# Patient Record
Sex: Male | Born: 1965 | Race: Asian | Hispanic: No | Marital: Married | Smoking: Never smoker
Health system: Southern US, Community
[De-identification: ages and names within clinical notes are randomized; demographics above are authoritative.]

## PROBLEM LIST (undated history)

## (undated) DIAGNOSIS — S065XAA Traumatic subdural hemorrhage with loss of consciousness status unknown, initial encounter: Secondary | ICD-10-CM

## (undated) DIAGNOSIS — S065X9A Traumatic subdural hemorrhage with loss of consciousness of unspecified duration, initial encounter: Secondary | ICD-10-CM

## (undated) DIAGNOSIS — Z789 Other specified health status: Secondary | ICD-10-CM

## (undated) HISTORY — PX: COLONOSCOPY: SHX174

---

## 2017-11-06 DIAGNOSIS — E782 Mixed hyperlipidemia: Secondary | ICD-10-CM | POA: Diagnosis not present

## 2017-11-06 DIAGNOSIS — Z0000 Encounter for general adult medical examination without abnormal findings: Secondary | ICD-10-CM | POA: Diagnosis not present

## 2018-11-13 DIAGNOSIS — Z125 Encounter for screening for malignant neoplasm of prostate: Secondary | ICD-10-CM | POA: Diagnosis not present

## 2018-11-13 DIAGNOSIS — Z13220 Encounter for screening for lipoid disorders: Secondary | ICD-10-CM | POA: Diagnosis not present

## 2018-11-13 DIAGNOSIS — Z0000 Encounter for general adult medical examination without abnormal findings: Secondary | ICD-10-CM | POA: Diagnosis not present

## 2019-05-04 DIAGNOSIS — M542 Cervicalgia: Secondary | ICD-10-CM | POA: Diagnosis not present

## 2019-05-05 ENCOUNTER — Encounter (HOSPITAL_COMMUNITY): Payer: Self-pay | Admitting: Emergency Medicine

## 2019-05-05 ENCOUNTER — Emergency Department (HOSPITAL_COMMUNITY): Payer: 59

## 2019-05-05 ENCOUNTER — Emergency Department (HOSPITAL_COMMUNITY)
Admission: EM | Admit: 2019-05-05 | Discharge: 2019-05-05 | Disposition: A | Discharge status: Home / Self Care | Payer: 59 | Attending: Emergency Medicine | Admitting: Emergency Medicine

## 2019-05-05 ENCOUNTER — Other Ambulatory Visit: Payer: Self-pay

## 2019-05-05 DIAGNOSIS — R519 Headache, unspecified: Secondary | ICD-10-CM

## 2019-05-05 DIAGNOSIS — Z79899 Other long term (current) drug therapy: Secondary | ICD-10-CM | POA: Diagnosis not present

## 2019-05-05 DIAGNOSIS — R51 Headache: Secondary | ICD-10-CM | POA: Diagnosis present

## 2019-05-05 LAB — CBC WITH DIFFERENTIAL/PLATELET
Abs Immature Granulocytes: 0.02 10*3/uL (ref 0.00–0.07)
Basophils Absolute: 0 10*3/uL (ref 0.0–0.1)
Basophils Relative: 1 %
Eosinophils Absolute: 0 10*3/uL (ref 0.0–0.5)
Eosinophils Relative: 0 %
HCT: 37.6 % — ABNORMAL LOW (ref 39.0–52.0)
Hemoglobin: 12.6 g/dL — ABNORMAL LOW (ref 13.0–17.0)
Immature Granulocytes: 0 %
Lymphocytes Relative: 20 %
Lymphs Abs: 1.2 10*3/uL (ref 0.7–4.0)
MCH: 31 pg (ref 26.0–34.0)
MCHC: 33.5 g/dL (ref 30.0–36.0)
MCV: 92.4 fL (ref 80.0–100.0)
Monocytes Absolute: 0.4 10*3/uL (ref 0.1–1.0)
Monocytes Relative: 6 %
Neutro Abs: 4.3 10*3/uL (ref 1.7–7.7)
Neutrophils Relative %: 73 %
Platelets: 231 10*3/uL (ref 150–400)
RBC: 4.07 MIL/uL — ABNORMAL LOW (ref 4.22–5.81)
RDW: 11.9 % (ref 11.5–15.5)
WBC: 5.9 10*3/uL (ref 4.0–10.5)
nRBC: 0 % (ref 0.0–0.2)

## 2019-05-05 LAB — BASIC METABOLIC PANEL
Anion gap: 11 (ref 5–15)
BUN: 17 mg/dL (ref 6–20)
CO2: 22 mmol/L (ref 22–32)
Calcium: 9 mg/dL (ref 8.9–10.3)
Chloride: 106 mmol/L (ref 98–111)
Creatinine, Ser: 0.83 mg/dL (ref 0.61–1.24)
GFR calc Af Amer: >60 mL/min (ref >60)
GFR calc non Af Amer: >60 mL/min (ref >60)
Glucose, Bld: 115 mg/dL — ABNORMAL HIGH (ref 70–99)
Potassium: 3.7 mmol/L (ref 3.5–5.1)
Sodium: 139 mmol/L (ref 135–145)

## 2019-05-05 LAB — TROPONIN I: Troponin I: <0.03 ng/mL (ref <0.03)

## 2019-05-05 MED ORDER — IOHEXOL 300 MG/ML  SOLN: 100.0000 mL | Freq: Once | INTRAMUSCULAR | Status: DC

## 2019-05-05 MED ORDER — IOHEXOL 350 MG/ML SOLN
75.0000 mL | Freq: Once | INTRAVENOUS | Status: AC | PRN
  Administered 2019-05-05: 75 mL via INTRAVENOUS

## 2019-05-05 NOTE — ED Notes (Signed)
Patient transported to CT 

## 2019-05-05 NOTE — ED Triage Notes (Signed)
Pt started having a headache on Friday. No relief over weekend and began to have nausea, dizzyness and ringing of ears w/neck tightness. Pt seen at urgent care 05/04/19.  Temp: 97.4 Pain denies pain at this time

## 2019-05-05 NOTE — ED Notes (Signed)
Pt verbalized understanding of follow up and discharge instructions. IV removed,bleeding controlled and tolerated well. Pt ambulated to restroom with steady gait.

## 2019-05-05 NOTE — Discharge Instructions (Signed)
You have been diagnosed today with Headache.  At this time there does not appear to be the presence of an emergent medical condition, however there is always the potential for conditions to change. Please read and follow the below instructions.  Please return to the Emergency Department immediately for any new or worsening symptoms. Please be sure to follow up with your Primary Care Provider within one week regarding your visit today; please call their office to schedule an appointment even if you are feeling better for a follow-up visit. You may continue taking your muscle relaxer medication to help with your musculoskeletal neck pain.  Please get plenty of rest and drink plenty of water over the next few days to help with your symptoms.  Return to the Emergency department immediately for any new or worsening symptoms. Additionally your CT scan today showed plaque buildup on your arteries (aortic atherosclerosis), please discuss this with your primary care provider at your next visit.  Get help right away if: Your headache gets very bad quickly. Your headache gets worse after a lot of physical activity. You keep throwing up. You have a stiff neck. You have trouble seeing. You have trouble speaking. You have pain in the eye or ear. Your muscles are weak or you lose muscle control. You lose your balance or have trouble walking. You feel like you will pass out (faint) or you pass out. You are mixed up (confused). You have a seizure. Any new/concerning or worsening symptoms.  Please read the additional information packets attached to your discharge summary.  Do not take your medicine if  develop an itchy rash, swelling in your mouth or lips, or difficulty breathing.

## 2019-05-05 NOTE — ED Provider Notes (Signed)
[facility] EMERGENCY DEPARTMENT Provider Note   CSN: [id number] Arrival date & time: 05/05/19  1043    History   Chief Complaint Chief Complaint  Patient presents with   Headache    HPI John Michael is a 53 y.o. male presenting today for headache and nausea.  Patient reports that 3 days ago he developed a headache in his occipital area described as a throbbing sensation moderate intensity that was constant without radiation.  Patient reports some associated nausea without vomiting.  Patient reports that his headache is improved with lying down.  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  Patient states that he has had some tightness around his neck for the past several days.  He denies any injury or trauma.  Additionally patient reports ringing in his ears and dizziness upon standing that quickly resolves with staying still or with rest.    Patient reports that he was seen in urgent care yesterday and prescribed a muscle relaxer which has helped with his pain.  Patient reports that the muscle relaxer helped greatly yesterday with complete resolution of the symptoms.  Patient reports that upon awakening today and standing up he had return of his headache, tinnitus and nausea, EMS was called and patient was brought to the ER.  Upon initial evaluation patient is resting comfortably no acute distress.  Patient states that he is still having some mild nausea however states that his headache has completely resolved and he is no longer having pain.  Patient denies fever/chills, cough, chest pain/shortness of breath, abdominal pain, vomiting/diarrhea, vision changes, photophobia/phonophobia, injury/trauma, neck stiffness, numbness/weakness or tingling, difficulty speaking or any additional concerns.    HPI  History reviewed. No pertinent past medical history.  There are no active problems to display for this patient.   History  reviewed. No pertinent surgical history.    Home Medications    Prior to Admission medications   Medication Sig Start Date End Date Taking? Authorizing Provider  diclofenac (CATAFLAM) 50 MG tablet Take 50 mg by mouth 3 (three) times daily. 05/04/19  Yes [provider]  methocarbamol (ROBAXIN) 750 MG tablet Take 750 mg by mouth 3 (three) times daily. 05/04/19  Yes [provider]  Multiple Vitamin (MULTIVITAMIN WITH MINERALS) TABS tablet Take 1 tablet by mouth daily.   Yes [provider]    Family History No family history on file.  Social History Social History   Tobacco Use   Smoking status: Not on file  Substance Use Topics   Alcohol use: Yes   Drug use: Never     Allergies   Patient has no known allergies.   Review of Systems Review of Systems  Constitutional: Negative.  Negative for chills and fever.  Respiratory: Negative.  Negative for shortness of breath.   Cardiovascular: Negative.  Negative for chest pain.  Gastrointestinal: Positive for nausea. Negative for abdominal pain, diarrhea and vomiting.  Musculoskeletal: Positive for myalgias (Trapezius soreness bilateral). Negative for back pain and neck stiffness.  Neurological: Positive for light-headedness (Resolved) and headaches (Resolved). Negative for syncope, speech difficulty, weakness and numbness.  All other systems reviewed and are negative.  Physical Exam Updated Vital Signs BP 108/72    Pulse (!) 59    Temp 98.6 F (37 C) (Oral)    Resp 19    Ht 5\' 6"  (1.676 m)    Wt 63.5 kg    SpO2 98%    BMI  22.60 kg/m   Physical Exam Constitutional:      General: He is not in acute distress.    Appearance: Normal appearance. He is well-developed. He is not ill-appearing or diaphoretic.  HENT:     Head: Normocephalic and atraumatic. No raccoon eyes, Battle's sign, abrasion or contusion.     Jaw: There is normal jaw occlusion. No trismus.     Right Ear: Tympanic membrane normal.      Left Ear: Tympanic membrane normal.     Ears:     Comments: Hearing grossly intact bilaterally    Nose: Nose normal. No rhinorrhea.     Right Nostril: No epistaxis.     Left Nostril: No epistaxis.     Mouth/Throat:     Lips: Pink.     Mouth: Mucous membranes are moist.     Pharynx: Oropharynx is clear. Uvula midline.  Eyes:     General: Vision grossly intact. Gaze aligned appropriately.     Extraocular Movements: Extraocular movements intact.     Conjunctiva/sclera: Conjunctivae normal.     Pupils: Pupils are equal, round, and reactive to light.     Comments: Visual fields grossly intact bilaterally  Neck:     Musculoskeletal: Full passive range of motion without pain, normal range of motion and neck supple. No neck rigidity.     Trachea: Trachea and phonation normal. No tracheal tenderness or tracheal deviation.     Meningeal: Brudzinski's sign and Kernig's sign absent.  Cardiovascular:     Rate and Rhythm: Normal rate and regular rhythm.     Pulses:          Dorsalis pedis pulses are 2+ on the right side and 2+ on the left side.       Posterior tibial pulses are 2+ on the right side and 2+ on the left side.     Heart sounds: Normal heart sounds.  Pulmonary:     Effort: Pulmonary effort is normal. No respiratory distress.     Breath sounds: Normal breath sounds and air entry.  Abdominal:     General: Bowel sounds are normal. There is no distension.     Palpations: Abdomen is soft.     Tenderness: There is no abdominal tenderness. There is no guarding or rebound.  Musculoskeletal:     Comments: No midline C/T/L spinal tenderness to palpation, no paraspinal muscle tenderness, no deformity, crepitus, or step-off noted. No sign of injury to the neck or back.  Feet:     Right foot:     Protective Sensation: 3 sites tested. 3 sites sensed.     Left foot:     Protective Sensation: 3 sites tested. 3 sites sensed.  Skin:    General: Skin is warm and dry.  Neurological:     Mental  Status: He is alert and oriented to person, place, and time.     GCS: GCS eye subscore is 4. GCS verbal subscore is 5. GCS motor subscore is 6.     Comments: Mental Status: Alert, oriented, thought content appropriate, able to give a coherent history. Speech fluent without evidence of aphasia. Able to follow 2 step commands without difficulty. Cranial Nerves: II: Peripheral visual fields grossly normal, pupils equal, round, reactive to light III,IV, VI: ptosis not present, extra-ocular motions intact bilaterally V,VII: smile symmetric, eyebrows raise symmetric, facial light touch sensation equal VIII: hearing grossly normal to voice X: uvula elevates symmetrically XI: bilateral shoulder shrug symmetric and strong XII: midline tongue extension without  fassiculations Motor: Normal tone. 5/5 strength in upper and lower extremities bilaterally including strong and equal grip strength and dorsiflexion/plantar flexion Sensory: Sensation intact to light touch in all extremities.Negative Romberg.  Deep Tendon Reflexes: 2+ and symmetric in the biceps and patella Cerebellar: normal finger-to-nose maze with bilateral upper extremities. Normal heel-to -shin balance bilaterally of the lower extremity. No pronator drift.  Gait: normal gait and balance CV: distal pulses palpable throughout  Psychiatric:        Mood and Affect: Mood normal.        Behavior: Behavior is cooperative.    ED Treatments / Results  Labs (all labs ordered are listed, but only abnormal results are displayed) Labs Reviewed  CBC WITH DIFFERENTIAL/PLATELET - Abnormal; Notable for the following components:      Result Value   RBC 4.07 (*)    Hemoglobin 12.6 (*)    HCT 37.6 (*)    All other components within normal limits  BASIC METABOLIC PANEL - Abnormal; Notable for the following components:   Glucose, Bld 115 (*)    All other components within normal limits  TROPONIN I    EKG EKG  Interpretation  Date/Time:  Monday May 05 2019 12:03:08 EDT Ventricular Rate:  60 PR Interval:    QRS Duration: 84 QT Interval:  416 QTC Calculation: 416 R Axis:   69 Text Interpretation:  Sinus rhythm Abnormal R-wave progression, early transition ST elevation, consider anterior injury vs  Early repolarization (normal variant) no prior available for comparison Confirmed by Tilden Fossa [phone]) on 05/05/2019 12:28:33 PM   Radiology Ct Angio Head W/cm &/or Wo Cm  Result Date: 05/05/2019 CLINICAL DATA:  Occipital headache beginning 3 days ago, now with dizziness, lightheadedness, and posterior neck pain. EXAM: CT ANGIOGRAPHY HEAD AND NECK TECHNIQUE: Multidetector CT imaging of the head and neck was performed using the standard protocol during bolus administration of intravenous contrast. Multiplanar CT image reconstructions and MIPs were obtained to evaluate the vascular anatomy. Carotid stenosis measurements (when applicable) are obtained utilizing NASCET criteria, using the distal internal carotid diameter as the denominator. CONTRAST:  50 mL Omnipaque 350 COMPARISON:  None. FINDINGS: CT HEAD FINDINGS Brain: There is no evidence of acute infarct, intracranial hemorrhage, mass, midline shift, or extra-axial fluid collection. The ventricles and sulci are within normal limits for age. Vascular: No suspicious vessel density. Skull: No fracture or focal osseous lesion. Sinuses: Mild mucosal thickening in the frontal and ethmoid sinuses. Clear mastoid air cells. Orbits: Unremarkable. Review of the MIP images confirms the above findings CTA NECK FINDINGS Aortic arch: Standard 3 vessel aortic arch with mild atherosclerotic plaque. Widely patent arch vessel origins. Soft and calcified plaque in the proximal subclavian arteries without significant stenosis. Right carotid system: Patent without evidence of stenosis or dissection. Left carotid system: Patent with mild, predominantly calcified plaque about the  carotid bifurcation. No evidence of stenosis or dissection. Vertebral arteries: Patent and codominant without evidence of significant stenosis or dissection. Focal nonstenotic calcified plaque at the right vertebral artery origin. Skeleton: Mild cervicothoracic spondylosis. Other neck: No evidence of cervical lymphadenopathy or mass. Upper chest: Minimal scarring in the lung apices. Review of the MIP images confirms the above findings CTA HEAD FINDINGS Anterior circulation: The internal carotid arteries are widely patent from skull base to carotid termini. ACAs and MCAs are patent without evidence of proximal branch occlusion or significant stenosis. No aneurysm is identified. Posterior circulation: The intracranial vertebral arteries are widely patent to the basilar. Patent PICA, AICA,  and SCA origins are identified bilaterally. The basilar artery is widely patent. There are small posterior communicating arteries bilaterally. PCAs are patent without evidence of significant proximal stenosis. No aneurysm is identified. Venous sinuses: Patent. Anatomic variants: None. Delayed phase: No abnormal enhancement. Review of the MIP images confirms the above findings IMPRESSION: 1. Unremarkable CT appearance of the brain. No evidence of acute intracranial abnormality. 2. Mild atherosclerosis without evidence of large vessel occlusion, significant stenosis, or aneurysm in the head and neck. 3.  Aortic Atherosclerosis (ICD10-I70.0). Electronically Signed   By: Sebastian Ache M.D.   On: 05/05/2019 13:40   Ct Angio Neck W And/or Wo Contrast  Result Date: 05/05/2019 CLINICAL DATA:  Occipital headache beginning 3 days ago, now with dizziness, lightheadedness, and posterior neck pain. EXAM: CT ANGIOGRAPHY HEAD AND NECK TECHNIQUE: Multidetector CT imaging of the head and neck was performed using the standard protocol during bolus administration of intravenous contrast. Multiplanar CT image reconstructions and MIPs were obtained to  evaluate the vascular anatomy. Carotid stenosis measurements (when applicable) are obtained utilizing NASCET criteria, using the distal internal carotid diameter as the denominator. CONTRAST:  50 mL Omnipaque 350 COMPARISON:  None. FINDINGS: CT HEAD FINDINGS Brain: There is no evidence of acute infarct, intracranial hemorrhage, mass, midline shift, or extra-axial fluid collection. The ventricles and sulci are within normal limits for age. Vascular: No suspicious vessel density. Skull: No fracture or focal osseous lesion. Sinuses: Mild mucosal thickening in the frontal and ethmoid sinuses. Clear mastoid air cells. Orbits: Unremarkable. Review of the MIP images confirms the above findings CTA NECK FINDINGS Aortic arch: Standard 3 vessel aortic arch with mild atherosclerotic plaque. Widely patent arch vessel origins. Soft and calcified plaque in the proximal subclavian arteries without significant stenosis. Right carotid system: Patent without evidence of stenosis or dissection. Left carotid system: Patent with mild, predominantly calcified plaque about the carotid bifurcation. No evidence of stenosis or dissection. Vertebral arteries: Patent and codominant without evidence of significant stenosis or dissection. Focal nonstenotic calcified plaque at the right vertebral artery origin. Skeleton: Mild cervicothoracic spondylosis. Other neck: No evidence of cervical lymphadenopathy or mass. Upper chest: Minimal scarring in the lung apices. Review of the MIP images confirms the above findings CTA HEAD FINDINGS Anterior circulation: The internal carotid arteries are widely patent from skull base to carotid termini. ACAs and MCAs are patent without evidence of proximal branch occlusion or significant stenosis. No aneurysm is identified. Posterior circulation: The intracranial vertebral arteries are widely patent to the basilar. Patent PICA, AICA, and SCA origins are identified bilaterally. The basilar artery is widely patent.  There are small posterior communicating arteries bilaterally. PCAs are patent without evidence of significant proximal stenosis. No aneurysm is identified. Venous sinuses: Patent. Anatomic variants: None. Delayed phase: No abnormal enhancement. Review of the MIP images confirms the above findings IMPRESSION: 1. Unremarkable CT appearance of the brain. No evidence of acute intracranial abnormality. 2. Mild atherosclerosis without evidence of large vessel occlusion, significant stenosis, or aneurysm in the head and neck. 3.  Aortic Atherosclerosis (ICD10-I70.0). Electronically Signed   By: Sebastian Ache M.D.   On: 05/05/2019 13:40    Procedures Procedures (including critical care time)  Medications Ordered in ED Medications  iohexol (OMNIPAQUE) 300 MG/ML solution 100 mL (has no administration in time range)  iohexol (OMNIPAQUE) 350 MG/ML injection 75 mL (75 mLs Intravenous Contrast Given 05/05/19 1247)     Initial Impression / Assessment and Plan / ED Course  I have reviewed the triage  vital signs and the nursing notes.  Pertinent labs & imaging results that were available during my care of the patient were reviewed by me and considered in my medical decision making (see chart for details).    53 year old otherwise healthy male presenting today with headache, tinnitus, trapezius muscular tenderness, nausea and lightheadedness.  All symptoms resolved upon initial evaluation.  No meningeal signs on initial examination.  Normal neuro exam.  No history of fever or chills.  EMS was called today after patient stood up from bed and had recurrence of his symptoms.  I have stood patient up from bed and ambulated patient around the room without recurrence of symptoms.  No pain about the temporal arteries or cervical arteries. - Patient with red flags of new onset headache age over 3250, case discussed with Dr. Madilyn Hookees who is seeing patient. - CT angio and blood work ordered. - CBC nonacute BMP  nonacute Troponin negative EKG: Sinus rhythm Abnormal R-wave progression, early transition ST elevation, consider anterior injury vs  Early repolarization (normal variant) no prior available for comparison Confirmed by Tilden Fossaees, Elizabeth  CTA Head/Neck:  IMPRESSION:  1. Unremarkable CT appearance of the brain. No evidence of acute  intracranial abnormality.  2. Mild atherosclerosis without evidence of large vessel occlusion,  significant stenosis, or aneurysm in the head and neck.  3. Aortic Atherosclerosis (ICD10-I70.0).  --- Patient reevaluated resting comfortably no acute distress.  Case rediscussed with Dr. Madilyn Hookees who will reevaluate patient. - Lumbar puncture was offered by Dr. Madilyn Hookees to the patient who refused.  Shared decision making made between patient and MD. Plan of care at this time is to discharge patient with return precautions.  No fevers, neck pain or nuchal rigidity to suggest meningitis. Patient is afebrile, non-toxic and well appearing. Reassuring neuro exam, normal gait around room and back. No cranial deficits, no speech deficits, negative pronator drift, normal/equal strength to all extremities.   PCP follow up strongly encouraged. I have reviewed return precautions including development of fever, nausea/vomiting or neurologic symptoms, vision changes, confusion, lethargy, difficulty speaking/walking, or other new/worsening/concerning symptoms.  At this time there does not appear to be any evidence of an acute emergency medical condition and the patient appears stable for discharge with appropriate outpatient follow up. Diagnosis was discussed with patient who verbalizes understanding of care plan and is agreeable to discharge. I have discussed return precautions with patient who verbalizes understanding of return precautions. Patient encouraged to follow-up with their PCP. All questions answered.  Patient has been discharged in good condition.  Patient's case rediscussed with  Dr. Madilyn Hookees who agrees with plan to discharge with follow-up.   Note: Portions of this report may have been transcribed using voice recognition software. Every effort was made to ensure accuracy; however, inadvertent computerized transcription errors may still be present. Final Clinical Impressions(s) / ED Diagnoses   Final diagnoses:  Nonintractable headache, unspecified chronicity pattern, unspecified headache type    ED Discharge Orders    None       Elizabeth PalauMorelli, Kammy Klett A, PA-C 05/05/19 1526    Tilden Fossaees, Elizabeth, MD 05/07/19 [phone]

## 2019-07-29 ENCOUNTER — Emergency Department (HOSPITAL_COMMUNITY): Payer: 59 | Admitting: Anesthesiology

## 2019-07-29 ENCOUNTER — Emergency Department (HOSPITAL_COMMUNITY): Payer: 59

## 2019-07-29 ENCOUNTER — Inpatient Hospital Stay (HOSPITAL_COMMUNITY)
Admission: EM | Admit: 2019-07-29 | Discharge: 2019-08-01 | DRG: 025 | Disposition: A | Discharge status: Home / Self Care | Payer: 59 | Source: Ambulatory Visit | Attending: Neurosurgery | Admitting: Neurosurgery

## 2019-07-29 ENCOUNTER — Encounter (HOSPITAL_COMMUNITY)
Admission: EM | Disposition: A | Discharge status: Home / Self Care | Payer: Self-pay | Source: Ambulatory Visit | Attending: Neurosurgery

## 2019-07-29 ENCOUNTER — Other Ambulatory Visit: Payer: Self-pay

## 2019-07-29 ENCOUNTER — Encounter (HOSPITAL_COMMUNITY): Payer: Self-pay | Admitting: Emergency Medicine

## 2019-07-29 DIAGNOSIS — Z20828 Contact with and (suspected) exposure to other viral communicable diseases: Secondary | ICD-10-CM | POA: Diagnosis present

## 2019-07-29 DIAGNOSIS — Z8249 Family history of ischemic heart disease and other diseases of the circulatory system: Secondary | ICD-10-CM | POA: Diagnosis not present

## 2019-07-29 DIAGNOSIS — G935 Compression of brain: Secondary | ICD-10-CM | POA: Diagnosis present

## 2019-07-29 DIAGNOSIS — R410 Disorientation, unspecified: Secondary | ICD-10-CM

## 2019-07-29 DIAGNOSIS — S065X9A Traumatic subdural hemorrhage with loss of consciousness of unspecified duration, initial encounter: Secondary | ICD-10-CM

## 2019-07-29 DIAGNOSIS — I6203 Nontraumatic chronic subdural hemorrhage: Principal | ICD-10-CM | POA: Diagnosis present

## 2019-07-29 DIAGNOSIS — G9349 Other encephalopathy: Secondary | ICD-10-CM | POA: Diagnosis present

## 2019-07-29 DIAGNOSIS — Z79899 Other long term (current) drug therapy: Secondary | ICD-10-CM | POA: Diagnosis not present

## 2019-07-29 DIAGNOSIS — S065XAA Traumatic subdural hemorrhage with loss of consciousness status unknown, initial encounter: Secondary | ICD-10-CM

## 2019-07-29 DIAGNOSIS — R4701 Aphasia: Secondary | ICD-10-CM | POA: Diagnosis present

## 2019-07-29 HISTORY — PX: BURR HOLE: SHX908

## 2019-07-29 LAB — COMPREHENSIVE METABOLIC PANEL
ALT: 19 U/L (ref 0–44)
AST: 19 U/L (ref 15–41)
Albumin: 3.7 g/dL (ref 3.5–5.0)
Alkaline Phosphatase: 47 U/L (ref 38–126)
Anion gap: 9 (ref 5–15)
BUN: 23 mg/dL — ABNORMAL HIGH (ref 6–20)
CO2: 25 mmol/L (ref 22–32)
Calcium: 9.1 mg/dL (ref 8.9–10.3)
Chloride: 107 mmol/L (ref 98–111)
Creatinine, Ser: 0.9 mg/dL (ref 0.61–1.24)
GFR calc Af Amer: >60 mL/min (ref >60)
GFR calc non Af Amer: >60 mL/min (ref >60)
Glucose, Bld: 106 mg/dL — ABNORMAL HIGH (ref 70–99)
Potassium: 4.2 mmol/L (ref 3.5–5.1)
Sodium: 141 mmol/L (ref 135–145)
Total Bilirubin: 0.6 mg/dL (ref 0.3–1.2)
Total Protein: 7.1 g/dL (ref 6.5–8.1)

## 2019-07-29 LAB — CBC WITH DIFFERENTIAL/PLATELET
Abs Immature Granulocytes: 0.03 10*3/uL (ref 0.00–0.07)
Basophils Absolute: 0 10*3/uL (ref 0.0–0.1)
Basophils Relative: 1 %
Eosinophils Absolute: 0 10*3/uL (ref 0.0–0.5)
Eosinophils Relative: 0 %
HCT: 40 % (ref 39.0–52.0)
Hemoglobin: 13.2 g/dL (ref 13.0–17.0)
Immature Granulocytes: 0 %
Lymphocytes Relative: 22 %
Lymphs Abs: 1.6 10*3/uL (ref 0.7–4.0)
MCH: 31.1 pg (ref 26.0–34.0)
MCHC: 33 g/dL (ref 30.0–36.0)
MCV: 94.1 fL (ref 80.0–100.0)
Monocytes Absolute: 0.6 10*3/uL (ref 0.1–1.0)
Monocytes Relative: 8 %
Neutro Abs: 5.2 10*3/uL (ref 1.7–7.7)
Neutrophils Relative %: 69 %
Platelets: 249 10*3/uL (ref 150–400)
RBC: 4.25 MIL/uL (ref 4.22–5.81)
RDW: 12.1 % (ref 11.5–15.5)
WBC: 7.6 10*3/uL (ref 4.0–10.5)
nRBC: 0 % (ref 0.0–0.2)

## 2019-07-29 LAB — LACTIC ACID, PLASMA: Lactic Acid, Venous: 1 mmol/L (ref 0.5–1.9)

## 2019-07-29 LAB — URINALYSIS, COMPLETE (UACMP) WITH MICROSCOPIC
Bacteria, UA: NONE SEEN
Bilirubin Urine: NEGATIVE
Glucose, UA: NEGATIVE mg/dL
Ketones, ur: 20 mg/dL — AB
Leukocytes,Ua: NEGATIVE
Nitrite: NEGATIVE
Protein, ur: NEGATIVE mg/dL
Specific Gravity, Urine: 1.013 (ref 1.005–1.030)
pH: 6 (ref 5.0–8.0)

## 2019-07-29 LAB — MRSA PCR SCREENING: MRSA by PCR: NEGATIVE

## 2019-07-29 LAB — RAPID URINE DRUG SCREEN, HOSP PERFORMED
Amphetamines: NOT DETECTED
Barbiturates: NOT DETECTED
Benzodiazepines: NOT DETECTED
Cocaine: NOT DETECTED
Opiates: NOT DETECTED
Tetrahydrocannabinol: NOT DETECTED

## 2019-07-29 LAB — ETHANOL: Alcohol, Ethyl (B): <10 mg/dL (ref <10)

## 2019-07-29 LAB — SARS CORONAVIRUS 2 BY RT PCR (HOSPITAL ORDER, PERFORMED IN ~~LOC~~ HOSPITAL LAB): SARS Coronavirus 2: NEGATIVE

## 2019-07-29 LAB — CBG MONITORING, ED: Glucose-Capillary: 106 mg/dL — ABNORMAL HIGH (ref 70–99)

## 2019-07-29 LAB — PROTIME-INR
INR: 1.1 (ref 0.8–1.2)
Prothrombin Time: 13.7 seconds (ref 11.4–15.2)

## 2019-07-29 LAB — AMMONIA: Ammonia: <9 umol/L — ABNORMAL LOW (ref 9–35)

## 2019-07-29 SURGERY — CREATION, CRANIAL BURR HOLE
Anesthesia: General | Site: Scalp | Laterality: Left

## 2019-07-29 MED ORDER — CEFAZOLIN SODIUM-DEXTROSE 2-3 GM-%(50ML) IV SOLR
INTRAVENOUS | Status: DC | PRN
  Administered 2019-07-29: 2 g via INTRAVENOUS

## 2019-07-29 MED ORDER — ACETAMINOPHEN 650 MG RE SUPP: 650.0000 mg | RECTAL | Status: DC | PRN

## 2019-07-29 MED ORDER — ACETAMINOPHEN 160 MG/5ML PO SOLN: 325.0000 mg | Freq: Once | ORAL | Status: DC | PRN

## 2019-07-29 MED ORDER — THROMBIN 20000 UNITS EX SOLR
CUTANEOUS | Status: AC
  Filled 2019-07-29: qty 20000

## 2019-07-29 MED ORDER — LACTATED RINGERS IV SOLN: INTRAVENOUS | Status: DC

## 2019-07-29 MED ORDER — LIDOCAINE-EPINEPHRINE 1 %-1:100000 IJ SOLN
INTRAMUSCULAR | Status: DC | PRN
  Administered 2019-07-29: 20 mL

## 2019-07-29 MED ORDER — PHENYLEPHRINE 40 MCG/ML (10ML) SYRINGE FOR IV PUSH (FOR BLOOD PRESSURE SUPPORT)
PREFILLED_SYRINGE | INTRAVENOUS | Status: AC
  Filled 2019-07-29: qty 30

## 2019-07-29 MED ORDER — HYDROCODONE-ACETAMINOPHEN 5-325 MG PO TABS
1.0000 | ORAL_TABLET | ORAL | Status: DC | PRN
  Administered 2019-07-29 – 2019-07-30 (×2): 1 via ORAL
  Filled 2019-07-29 (×3): qty 1

## 2019-07-29 MED ORDER — THROMBIN 5000 UNITS EX SOLR
CUTANEOUS | Status: AC
  Filled 2019-07-29: qty 5000

## 2019-07-29 MED ORDER — THROMBIN 5000 UNITS EX SOLR
OROMUCOSAL | Status: DC | PRN
  Administered 2019-07-29: 5 mL via TOPICAL

## 2019-07-29 MED ORDER — HYDROMORPHONE HCL 1 MG/ML IJ SOLN
0.5000 mg | INTRAMUSCULAR | Status: DC | PRN
  Administered 2019-07-30 (×2): 1 mg via INTRAVENOUS
  Filled 2019-07-29 (×2): qty 1

## 2019-07-29 MED ORDER — BACITRACIN ZINC 500 UNIT/GM EX OINT
TOPICAL_OINTMENT | CUTANEOUS | Status: AC
  Filled 2019-07-29: qty 28.35

## 2019-07-29 MED ORDER — EPHEDRINE 5 MG/ML INJ
INTRAVENOUS | Status: AC
  Filled 2019-07-29: qty 20

## 2019-07-29 MED ORDER — ACETAMINOPHEN 325 MG PO TABS: 325.0000 mg | ORAL_TABLET | Freq: Once | ORAL | Status: DC | PRN

## 2019-07-29 MED ORDER — CHLORHEXIDINE GLUCONATE CLOTH 2 % EX PADS
6.0000 | MEDICATED_PAD | Freq: Every day | CUTANEOUS | Status: DC
  Administered 2019-07-29 – 2019-07-30 (×2): 6 via TOPICAL

## 2019-07-29 MED ORDER — PROMETHAZINE HCL 25 MG PO TABS: 12.5000 mg | ORAL_TABLET | ORAL | Status: DC | PRN

## 2019-07-29 MED ORDER — LACTATED RINGERS IV SOLN
INTRAVENOUS | Status: DC | PRN
  Administered 2019-07-29: 20:00:00 via INTRAVENOUS

## 2019-07-29 MED ORDER — SODIUM CHLORIDE 0.9 % IV BOLUS
1000.0000 mL | Freq: Once | INTRAVENOUS | Status: AC
  Administered 2019-07-29: 1000 mL via INTRAVENOUS

## 2019-07-29 MED ORDER — FENTANYL CITRATE (PF) 250 MCG/5ML IJ SOLN
INTRAMUSCULAR | Status: DC | PRN
  Administered 2019-07-29 (×2): 50 ug via INTRAVENOUS

## 2019-07-29 MED ORDER — ACETAMINOPHEN 325 MG PO TABS: 650.0000 mg | ORAL_TABLET | ORAL | Status: DC | PRN

## 2019-07-29 MED ORDER — FENTANYL CITRATE (PF) 100 MCG/2ML IJ SOLN: 25.0000 ug | INTRAMUSCULAR | Status: DC | PRN

## 2019-07-29 MED ORDER — LIDOCAINE HCL (CARDIAC) PF 100 MG/5ML IV SOSY
PREFILLED_SYRINGE | INTRAVENOUS | Status: DC | PRN
  Administered 2019-07-29: 60 mg via INTRATRACHEAL

## 2019-07-29 MED ORDER — SODIUM CHLORIDE 0.9 % IV SOLN
INTRAVENOUS | Status: DC
  Administered 2019-07-29 – 2019-07-30 (×4): via INTRAVENOUS

## 2019-07-29 MED ORDER — PANTOPRAZOLE SODIUM 40 MG IV SOLR
40.0000 mg | Freq: Every day | INTRAVENOUS | Status: DC
  Administered 2019-07-29: 22:00:00 40 mg via INTRAVENOUS
  Filled 2019-07-29: qty 40

## 2019-07-29 MED ORDER — SUCCINYLCHOLINE CHLORIDE 20 MG/ML IJ SOLN
INTRAMUSCULAR | Status: DC | PRN
  Administered 2019-07-29: 100 mg via INTRAVENOUS

## 2019-07-29 MED ORDER — LIDOCAINE 2% (20 MG/ML) 5 ML SYRINGE
INTRAMUSCULAR | Status: AC
  Filled 2019-07-29: qty 10

## 2019-07-29 MED ORDER — SUCCINYLCHOLINE CHLORIDE 200 MG/10ML IV SOSY
PREFILLED_SYRINGE | INTRAVENOUS | Status: AC
  Filled 2019-07-29: qty 30

## 2019-07-29 MED ORDER — ONDANSETRON HCL 4 MG/2ML IJ SOLN: 4.0000 mg | INTRAMUSCULAR | Status: DC | PRN

## 2019-07-29 MED ORDER — PROMETHAZINE HCL 25 MG/ML IJ SOLN: 6.2500 mg | INTRAMUSCULAR | Status: DC | PRN

## 2019-07-29 MED ORDER — ARTIFICIAL TEARS OPHTHALMIC OINT
TOPICAL_OINTMENT | OPHTHALMIC | Status: AC
  Filled 2019-07-29: qty 3.5

## 2019-07-29 MED ORDER — SODIUM CHLORIDE 0.9 % IV SOLN
INTRAVENOUS | Status: DC | PRN
  Administered 2019-07-29: 30 ug/min via INTRAVENOUS

## 2019-07-29 MED ORDER — CEFAZOLIN SODIUM-DEXTROSE 2-4 GM/100ML-% IV SOLN: 2.0000 g | INTRAVENOUS | Status: DC

## 2019-07-29 MED ORDER — IOHEXOL 350 MG/ML SOLN
75.0000 mL | Freq: Once | INTRAVENOUS | Status: AC | PRN
  Administered 2019-07-29: 75 mL via INTRAVENOUS

## 2019-07-29 MED ORDER — LABETALOL HCL 5 MG/ML IV SOLN: 10.0000 mg | INTRAVENOUS | Status: DC | PRN

## 2019-07-29 MED ORDER — SODIUM CHLORIDE 0.9 % IV SOLN
INTRAVENOUS | Status: DC | PRN
  Administered 2019-07-29: 500 mL

## 2019-07-29 MED ORDER — PROPOFOL 10 MG/ML IV BOLUS
INTRAVENOUS | Status: DC | PRN
  Administered 2019-07-29: 130 mg via INTRAVENOUS

## 2019-07-29 MED ORDER — ADULT MULTIVITAMIN W/MINERALS CH
1.0000 | ORAL_TABLET | Freq: Every day | ORAL | Status: DC
  Administered 2019-07-30 – 2019-08-01 (×3): 1 via ORAL
  Filled 2019-07-29 (×3): qty 1

## 2019-07-29 MED ORDER — SUGAMMADEX SODIUM 200 MG/2ML IV SOLN
INTRAVENOUS | Status: DC | PRN
  Administered 2019-07-29: 300 mg via INTRAVENOUS

## 2019-07-29 MED ORDER — LEVETIRACETAM IN NACL 500 MG/100ML IV SOLN
500.0000 mg | Freq: Two times a day (BID) | INTRAVENOUS | Status: DC
  Administered 2019-07-29 – 2019-07-30 (×3): 500 mg via INTRAVENOUS
  Filled 2019-07-29 (×3): qty 100

## 2019-07-29 MED ORDER — 0.9 % SODIUM CHLORIDE (POUR BTL) OPTIME
TOPICAL | Status: DC | PRN
  Administered 2019-07-29 (×3): 1000 mL

## 2019-07-29 MED ORDER — MEPERIDINE HCL 25 MG/ML IJ SOLN: 6.2500 mg | INTRAMUSCULAR | Status: DC | PRN

## 2019-07-29 MED ORDER — ROCURONIUM BROMIDE 10 MG/ML (PF) SYRINGE
PREFILLED_SYRINGE | INTRAVENOUS | Status: AC
  Filled 2019-07-29: qty 30

## 2019-07-29 MED ORDER — LIDOCAINE-EPINEPHRINE 1 %-1:100000 IJ SOLN
INTRAMUSCULAR | Status: AC
  Filled 2019-07-29: qty 1

## 2019-07-29 MED ORDER — PROPOFOL 10 MG/ML IV BOLUS
INTRAVENOUS | Status: AC
  Filled 2019-07-29: qty 40

## 2019-07-29 MED ORDER — ONDANSETRON HCL 4 MG/2ML IJ SOLN
INTRAMUSCULAR | Status: AC
  Filled 2019-07-29: qty 6

## 2019-07-29 MED ORDER — ROCURONIUM BROMIDE 100 MG/10ML IV SOLN
INTRAVENOUS | Status: DC | PRN
  Administered 2019-07-29: 50 mg via INTRAVENOUS

## 2019-07-29 MED ORDER — ONDANSETRON HCL 4 MG PO TABS: 4.0000 mg | ORAL_TABLET | ORAL | Status: DC | PRN

## 2019-07-29 MED ORDER — DOCUSATE SODIUM 100 MG PO CAPS
100.0000 mg | ORAL_CAPSULE | Freq: Two times a day (BID) | ORAL | Status: DC
  Administered 2019-07-29 – 2019-08-01 (×6): 100 mg via ORAL
  Filled 2019-07-29 (×6): qty 1

## 2019-07-29 MED ORDER — ONDANSETRON HCL 4 MG/2ML IJ SOLN
INTRAMUSCULAR | Status: DC | PRN
  Administered 2019-07-29: 4 mg via INTRAVENOUS

## 2019-07-29 MED ORDER — PHENYLEPHRINE HCL-NACL 10-0.9 MG/250ML-% IV SOLN
INTRAVENOUS | Status: AC
  Filled 2019-07-29: qty 250

## 2019-07-29 MED ORDER — CEFAZOLIN SODIUM-DEXTROSE 1-4 GM/50ML-% IV SOLN
1.0000 g | Freq: Three times a day (TID) | INTRAVENOUS | Status: AC
  Administered 2019-07-30 (×2): 1 g via INTRAVENOUS
  Filled 2019-07-29 (×2): qty 50

## 2019-07-29 MED ORDER — NALOXONE HCL 0.4 MG/ML IJ SOLN: 0.0800 mg | INTRAMUSCULAR | Status: DC | PRN

## 2019-07-29 MED ORDER — ACETAMINOPHEN 10 MG/ML IV SOLN: 1000.0000 mg | Freq: Once | INTRAVENOUS | Status: DC | PRN

## 2019-07-29 MED ORDER — FENTANYL CITRATE (PF) 250 MCG/5ML IJ SOLN
INTRAMUSCULAR | Status: AC
  Filled 2019-07-29: qty 5

## 2019-07-29 SURGICAL SUPPLY — 49 items
BAG DECANTER FOR FLEXI CONT (MISCELLANEOUS) ×2 IMPLANT
BLADE CLIPPER SURG (BLADE) ×2 IMPLANT
BUR ACORN 6.0 (BURR) ×2 IMPLANT
CANISTER SUCT 3000ML PPV (MISCELLANEOUS) ×4 IMPLANT
CARTRIDGE OIL MAESTRO DRILL (MISCELLANEOUS) ×1 IMPLANT
DERMABOND ADVANCED (GAUZE/BANDAGES/DRESSINGS) ×1
DERMABOND ADVANCED .7 DNX12 (GAUZE/BANDAGES/DRESSINGS) ×1 IMPLANT
DIFFUSER DRILL AIR PNEUMATIC (MISCELLANEOUS) ×2 IMPLANT
DRAIN CHANNEL 10M FLAT 3/4 FLT (DRAIN) ×2 IMPLANT
DRAPE NEUROLOGICAL W/INCISE (DRAPES) ×2 IMPLANT
DRAPE WARM FLUID 44X44 (DRAPES) ×2 IMPLANT
ELECT REM PT RETURN 9FT ADLT (ELECTROSURGICAL) ×2
ELECTRODE REM PT RTRN 9FT ADLT (ELECTROSURGICAL) ×1 IMPLANT
EVACUATOR SILICONE 100CC (DRAIN) ×2 IMPLANT
GAUZE SPONGE 4X4 12PLY STRL LF (GAUZE/BANDAGES/DRESSINGS) ×2 IMPLANT
GLOVE BIO SURGEON STRL SZ 6.5 (GLOVE) ×2 IMPLANT
GLOVE BIO SURGEON STRL SZ7 (GLOVE) ×2 IMPLANT
GLOVE BIOGEL PI IND STRL 6.5 (GLOVE) ×1 IMPLANT
GLOVE BIOGEL PI IND STRL 7.5 (GLOVE) ×2 IMPLANT
GLOVE BIOGEL PI INDICATOR 6.5 (GLOVE) ×1
GLOVE BIOGEL PI INDICATOR 7.5 (GLOVE) ×2
GLOVE ECLIPSE 9.0 STRL (GLOVE) ×4 IMPLANT
GOWN STRL REUS W/ TWL LRG LVL3 (GOWN DISPOSABLE) ×2 IMPLANT
GOWN STRL REUS W/ TWL XL LVL3 (GOWN DISPOSABLE) ×2 IMPLANT
GOWN STRL REUS W/TWL 2XL LVL3 (GOWN DISPOSABLE) IMPLANT
GOWN STRL REUS W/TWL LRG LVL3 (GOWN DISPOSABLE) ×2
GOWN STRL REUS W/TWL XL LVL3 (GOWN DISPOSABLE) ×2
KIT BASIN OR (CUSTOM PROCEDURE TRAY) ×2 IMPLANT
KIT TURNOVER KIT B (KITS) ×2 IMPLANT
NEEDLE HYPO 25X1 1.5 SAFETY (NEEDLE) ×2 IMPLANT
NS IRRIG 1000ML POUR BTL (IV SOLUTION) ×6 IMPLANT
OIL CARTRIDGE MAESTRO DRILL (MISCELLANEOUS) ×2
PACK CRANIOTOMY CUSTOM (CUSTOM PROCEDURE TRAY) ×2 IMPLANT
PAD ARMBOARD 7.5X6 YLW CONV (MISCELLANEOUS) ×6 IMPLANT
PATTIES SURGICAL .25X.25 (GAUZE/BANDAGES/DRESSINGS) IMPLANT
PATTIES SURGICAL .5 X.5 (GAUZE/BANDAGES/DRESSINGS) IMPLANT
PATTIES SURGICAL .5 X3 (DISPOSABLE) IMPLANT
PATTIES SURGICAL 1X1 (DISPOSABLE) IMPLANT
PIN MAYFIELD SKULL DISP (PIN) IMPLANT
SPONGE NEURO XRAY DETECT 1X3 (DISPOSABLE) IMPLANT
SPONGE SURGIFOAM ABS GEL 100 (HEMOSTASIS) IMPLANT
STAPLER VISISTAT 35W (STAPLE) ×2 IMPLANT
STOCKINETTE TUBULAR 6 INCH (GAUZE/BANDAGES/DRESSINGS) ×2 IMPLANT
SUT NURALON 4 0 TR CR/8 (SUTURE) ×4 IMPLANT
SUT VIC AB 2-0 CT1 18 (SUTURE) ×2 IMPLANT
TOWEL GREEN STERILE (TOWEL DISPOSABLE) ×2 IMPLANT
TOWEL GREEN STERILE FF (TOWEL DISPOSABLE) ×2 IMPLANT
TRAY FOLEY MTR SLVR 16FR STAT (SET/KITS/TRAYS/PACK) ×2 IMPLANT
WATER STERILE IRR 1000ML POUR (IV SOLUTION) ×2 IMPLANT

## 2019-07-29 NOTE — Brief Op Note (Signed)
07/29/2019  8:37 PM  PATIENT:  John Michael  53 y.o. male  PRE-OPERATIVE DIAGNOSIS:  Left chronic subdural hematoma  POST-OPERATIVE DIAGNOSIS:  Left chronic subdural hematoma  PROCEDURE:  Procedure(s): BURR HOLE FOR EVACUATION OF SUBDURAL HEMATOMA (Left)  SURGEON:  Surgeon(s) and Role:    Earnie Larsson, MD - Primary  PHYSICIAN ASSISTANT:   ASSISTANTSMearl Latin   ANESTHESIA:   general  EBL:  100 mL   BLOOD ADMINISTERED:none  DRAINS: (74mm) Blake drain(s) in the subdural space   LOCAL MEDICATIONS USED:  LIDOCAINE   SPECIMEN:  No Specimen  DISPOSITION OF SPECIMEN:  N/A  COUNTS:  YES  TOURNIQUET:  * No tourniquets in log *  DICTATION: .Dragon Dictation  PLAN OF CARE: Admit to inpatient   PATIENT DISPOSITION:  PACU - hemodynamically stable.   Delay start of Pharmacological VTE agent (>24hrs) due to surgical blood loss or risk of bleeding: yes

## 2019-07-29 NOTE — Anesthesia Preprocedure Evaluation (Addendum)
Anesthesia Evaluation  Patient identified by MRN, date of birth, ID band Patient unresponsive    Reviewed: Allergy & Precautions, NPO status , Patient's Chart, lab work & pertinent test results  Airway Mallampati: II  TM Distance: >3 FB Neck ROM: Full    Dental  (+) Partial Upper, Dental Advisory Given   Pulmonary neg pulmonary ROS,    breath sounds clear to auscultation       Cardiovascular negative cardio ROS   Rhythm:Regular Rate:Normal     Neuro/Psych negative neurological ROS     GI/Hepatic negative GI ROS, Neg liver ROS,   Endo/Other  negative endocrine ROS  Renal/GU negative Renal ROS     Musculoskeletal   Abdominal Normal abdominal exam  (+)   Peds  Hematology negative hematology ROS (+)   Anesthesia Other Findings   Reproductive/Obstetrics                             Anesthesia Physical Anesthesia Plan  ASA: III and emergent  Anesthesia Plan: General   Post-op Pain Management:    Induction: Intravenous  PONV Risk Score and Plan: 3 and Ondansetron, Dexamethasone and Treatment may vary due to age or medical condition  Airway Management Planned: Oral ETT  Additional Equipment:   Intra-op Plan:   Post-operative Plan: Possible Post-op intubation/ventilation  Informed Consent: I have reviewed the patients History and Physical, chart, labs and discussed the procedure including the risks, benefits and alternatives for the proposed anesthesia with the patient or authorized representative who has indicated his/her understanding and acceptance.     Consent reviewed with POA and Dental advisory given  Plan Discussed with: CRNA, Anesthesiologist and Surgeon  Anesthesia Plan Comments: (Possible arterial line. )       Anesthesia Quick Evaluation

## 2019-07-29 NOTE — ED Notes (Signed)
Patient transported to CT 

## 2019-07-29 NOTE — Anesthesia Postprocedure Evaluation (Signed)
Anesthesia Post Note  Patient: John Michael  Procedure(s) Performed: Georganna Skeans FOR EVACUATION OF SUBDURAL HEMATOMA (Left Scalp)     Patient location during evaluation: PACU Anesthesia Type: General Level of consciousness: awake and alert Pain management: pain level controlled Vital Signs Assessment: post-procedure vital signs reviewed and stable Respiratory status: spontaneous breathing, nonlabored ventilation, respiratory function stable and patient connected to nasal cannula oxygen Cardiovascular status: blood pressure returned to baseline and stable Postop Assessment: no apparent nausea or vomiting Anesthetic complications: no    Last Vitals:  Vitals:   07/29/19 2115 07/29/19 2130  BP: 123/78   Pulse: (!) 56   Resp: 13 14  Temp: 36.6 C   SpO2: 100% 100%    Last Pain:  Vitals:   07/29/19 2130  TempSrc:   PainSc: 0-No pain                 Effie Berkshire

## 2019-07-29 NOTE — Consult Note (Addendum)
Neurology Consultation  Reason for Consult: Sudden onset confusion Referring Physician: [place]  History is obtained from: Wife and notes  HPI: John Michael is a 53 y.o. male with no significant past medical history.  Per wife on 07/23/2019 he was getting ready to bring his daughter to [place] to start school.  He carried a large bag down the stairs and stated that he strained himself and then felt a headache.  At that time he did take a Tylenol for his headache.  On 07/25/2019 patient felt fine and started to drive to [place].  Wife states about 1 hour into the drive they noticed that he was not driving well and patient then had emesis and again later in the afternoon had emesis.  Later in the afternoon they took a tour of the campus and she noted that he was having difficulty walking and staying in a straight line.  Since 07/27/2019 he is severely declined.  She states that she actually has to feed him at this point, she has to remind him of what he is doing and what has happened.  She has to dress him.  She cannot trust him in the bathroom and feels he will fall down second to his imbalance.  He does not recall even simple things, and often times will just stare into space.  She also notes that he is very slow to respond.  As noted above, this has been a significantly fast decline over the last few days.  She brought him to primary care physician who recommended that he come to the emergency department for further work-up.   ED course: CMP, CBC, urinalysis, ammonia, ethanol   Chart review (none)   ROS:  Unable to obtain due to altered mental status.   History reviewed. No pertinent past medical history obtained as patient is confused and also looking at his primary care note he has no significant past medical history   Family History  Problem Relation Age of Onset  . Hypertension Mother   . Hypertension Father    Social History:   reports current alcohol use. He reports that he does not  use drugs. No history on file for tobacco.  Medications  Current Facility-Administered Medications:  .  [COMPLETED] sodium chloride 0.9 % bolus 1,000 mL, 1,000 mL, Intravenous, Once, Last Rate: 999 mL/hr at 07/29/19 1332, 1,000 mL at 07/29/19 1332 **AND** 0.9 %  sodium chloride infusion, , Intravenous, Continuous, Carmin Muskrat, MD, Last Rate: 125 mL/hr at 07/29/19 1449  Current Outpatient Medications:  .  diclofenac (CATAFLAM) 50 MG tablet, Take 50 mg by mouth 3 (three) times daily., Disp: , Rfl:  .  methocarbamol (ROBAXIN) 750 MG tablet, Take 750 mg by mouth 3 (three) times daily., Disp: , Rfl:  .  Multiple Vitamin (MULTIVITAMIN WITH MINERALS) TABS tablet, Take 1 tablet by mouth daily., Disp: , Rfl:    Exam: Current vital signs: BP 131/86   Pulse (!) 59   Temp 98.5 F (36.9 C) (Oral)   Resp 19   SpO2 100%  Vital signs in last 24 hours: Temp:  [98.5 F (36.9 C)] 98.5 F (36.9 C) (08/18 1252) Pulse Rate:  [51-65] 59 (08/18 1600) Resp:  [16-19] 19 (08/18 1600) BP: (113-131)/(72-90) 131/86 (08/18 1600) SpO2:  [99 %-100 %] 100 % (08/18 1600)  Physical Exam  Constitutional: Appears well-developed and well-nourished.  Psych: Affect appropriate to situation Eyes: No scleral injection HENT: No OP obstrucion Head: Normocephalic.  Cardiovascular: Normal rate and regular rhythm.  Respiratory: Effort normal, non-labored breathing GI: Soft.  No distension. There is no tenderness.  Skin: WDI  Neuro: Mental Status: Patient is awake however does not know the month, year, believes he is in [place], does not know how many years he has been married, does not know the president, he is able to state his wife's name.  Patient is able to follow simple commands but even then he does need visual cues. Cranial Nerves: II: Visual Fields are full.  III,IV, VI: EOMI without ptosis or diploplia. Pupils equal, round and reactive to light V: Facial sensation is symmetric to temperature VII:  Facial movement is symmetric.  VIII: hearing is intact to voice X: Palat elevates symmetrically XI: Shoulder shrug is symmetric. XII: tongue is midline without atrophy or fasciculations.  Motor: Tone is normal. Bulk is normal. 5/5 strength was present in all four extremities.  Sensory: Sensation is symmetric to light touch and temperature in the arms and legs. Deep Tendon Reflexes: 2+ and symmetric in the biceps and patellae.  Plantars: Toes are downgoing bilaterally.  Cerebellar: FNF and HKS are intact bilaterally  Labs I have reviewed labs in epic and the results pertinent to this consultation are:   CBC    Component Value Date/Time   WBC 7.6 07/29/2019 1306   RBC 4.25 07/29/2019 1306   HGB 13.2 07/29/2019 1306   HCT 40.0 07/29/2019 1306   PLT 249 07/29/2019 1306   MCV 94.1 07/29/2019 1306   MCH 31.1 07/29/2019 1306   MCHC 33.0 07/29/2019 1306   RDW 12.1 07/29/2019 1306   LYMPHSABS 1.6 07/29/2019 1306   MONOABS 0.6 07/29/2019 1306   EOSABS 0.0 07/29/2019 1306   BASOSABS 0.0 07/29/2019 1306    CMP     Component Value Date/Time   NA 141 07/29/2019 1306   K 4.2 07/29/2019 1306   CL 107 07/29/2019 1306   CO2 25 07/29/2019 1306   GLUCOSE 106 (H) 07/29/2019 1306   BUN 23 (H) 07/29/2019 1306   CREATININE 0.90 07/29/2019 1306   CALCIUM 9.1 07/29/2019 1306   PROT 7.1 07/29/2019 1306   ALBUMIN 3.7 07/29/2019 1306   AST 19 07/29/2019 1306   ALT 19 07/29/2019 1306   ALKPHOS 47 07/29/2019 1306   BILITOT 0.6 07/29/2019 1306   GFRNONAA >60 07/29/2019 1306   GFRAA >60 07/29/2019 1306    Lipid Panel  No results found for: CHOL, TRIG, HDL, CHOLHDL, VLDL, LDLCALC, LDLDIRECT   Imaging I have reviewed the images obtained:  CT-scan of the brain-pending  MRI examination of the brain-pending  CTA of head and neck-pending  Felicie Mornavid Smith PA-C [facility] [phone]  M-F  (9:00 am- 5:00 PM)  07/29/2019, 4:37 PM   NEUROHOSPITALIST ADDENDUM Performed a  face to face diagnostic evaluation.   I have reviewed the contents of history and physical exam as documented by PA/ARNP/Resident and agree with above documentation.  I have discussed and formulated the above plan as documented. Edits to the note have been made as needed.  ASSESSMENT AND PLAN 53 year old otherwise healthy male with complaints of headache since 8/14 with gradually worsening mental status.  Brought to the ED by wife as patient has poor memory recall and not oriented to his place and date.  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   Subdural Hemorrhage - Emergent Consult NS for evacuation - NO ASA/Anticoagulants -BP  goal less than 160 systolic  Neurology will be available as needed      Georgiana SpinnerSushanth Maebelle Sulton MD [facility] [phone]   If 7pm to 7am, please call on call as listed on AMION.

## 2019-07-29 NOTE — H&P (Signed)
John Michael is an 53 y.o. male.   Chief Complaint: Headache HPI: 53 year old male presents with headache and increasing aphasia.  Patient and family deny history of significant trauma.  Did note the onset of headache a couple weeks ago after lifting some heavy items.  Headache is been progressively worsening over the past 2 weeks.  Patient has noted difficulty with word finding and speech over the last 24 hours.  No obvious seizure activity.  Patient with no obvious weakness or sensory loss.  No history of anticoagulation.  Otherwise in good general health.  History reviewed. No pertinent past medical history.  History reviewed. No pertinent surgical history.  Family History  Problem Relation Age of Onset  . Hypertension Mother   . Hypertension Father    Social History:  reports current alcohol use. He reports that he does not use drugs. No history on file for tobacco.  Allergies: No Known Allergies  (Not in a hospital admission)   Results for orders placed or performed during the hospital encounter of 07/29/19 (from the past 48 hour(s))  CBG monitoring, ED     Status: Abnormal   Collection Time: 07/29/19 12:46 PM  Result Value Ref Range   Glucose-Capillary 106 (H) 70 - 99 mg/dL  Comprehensive metabolic panel     Status: Abnormal   Collection Time: 07/29/19  1:06 PM  Result Value Ref Range   Sodium 141 135 - 145 mmol/L   Potassium 4.2 3.5 - 5.1 mmol/L   Chloride 107 98 - 111 mmol/L   CO2 25 22 - 32 mmol/L   Glucose, Bld 106 (H) 70 - 99 mg/dL   BUN 23 (H) 6 - 20 mg/dL   Creatinine, Ser 8.460.90 0.61 - 1.24 mg/dL   Calcium 9.1 8.9 - 96.210.3 mg/dL   Total Protein 7.1 6.5 - 8.1 g/dL   Albumin 3.7 3.5 - 5.0 g/dL   AST 19 15 - 41 U/L   ALT 19 0 - 44 U/L   Alkaline Phosphatase 47 38 - 126 U/L   Total Bilirubin 0.6 0.3 - 1.2 mg/dL   GFR calc non Af Amer >60 >60 mL/min   GFR calc Af Amer >60 >60 mL/min   Anion gap 9 5 - 15    Comment: Performed at [facility], [address] [place], [place] [address]  Ethanol     Status: None   Collection Time: 07/29/19  1:06 PM  Result Value Ref Range   Alcohol, Ethyl (B) <10 <10 mg/dL    Comment: (NOTE) Lowest detectable limit for serum alcohol is 10 mg/dL. For medical purposes only. Performed at [facility], [address] [place], [place] [address]   CBC WITH DIFFERENTIAL     Status: None   Collection Time: 07/29/19  1:06 PM  Result Value Ref Range   WBC 7.6 4.0 - 10.5 K/uL   RBC 4.25 4.22 - 5.81 MIL/uL   Hemoglobin 13.2 13.0 - 17.0 g/dL   HCT 01.040.0 27.239.0 - 53.652.0 %   MCV 94.1 80.0 - 100.0 fL   MCH 31.1 26.0 - 34.0 pg   MCHC 33.0 30.0 - 36.0 g/dL   RDW 64.412.1 03.411.5 - 74.215.5 %   Platelets 249 150 - 400 K/uL   nRBC 0.0 0.0 - 0.2 %   Neutrophils Relative % 69 %   Neutro Abs 5.2 1.7 - 7.7 K/uL   Lymphocytes Relative 22 %   Lymphs Abs 1.6 0.7 - 4.0 K/uL   Monocytes  Relative 8 %   Monocytes Absolute 0.6 0.1 - 1.0 K/uL   Eosinophils Relative 0 %   Eosinophils Absolute 0.0 0.0 - 0.5 K/uL   Basophils Relative 1 %   Basophils Absolute 0.0 0.0 - 0.1 K/uL   Immature Granulocytes 0 %   Abs Immature Granulocytes 0.03 0.00 - 0.07 K/uL    Comment: Performed at [facility], [address], [place], [place] [address]  Ammonia     Status: Abnormal   Collection Time: 07/29/19  1:25 PM  Result Value Ref Range   Ammonia <9 (L) 9 - 35 umol/L    Comment: REPEATED TO VERIFY Performed at [facility], [address] [place], [place] [address]   Lactic acid, plasma     Status: None   Collection Time: 07/29/19  1:25 PM  Result Value Ref Range   Lactic Acid, Venous 1.0 0.5 - 1.9 mmol/L    Comment: Performed at [facility], [address], [place], [place] [address]  SARS Coronavirus 2 [facility] order, Performed in [facility] hospital lab) Nasopharyngeal Nasopharyngeal Swab     Status: None   Collection Time: 07/29/19  2:27 PM   Specimen: Nasopharyngeal Swab  Result Value Ref Range   SARS Coronavirus 2  NEGATIVE NEGATIVE    Comment: (NOTE) If result is NEGATIVE SARS-CoV-2 target nucleic acids are NOT DETECTED. The SARS-CoV-2 RNA is generally detectable in upper and lower  respiratory specimens during the acute phase of infection. The lowest  concentration of SARS-CoV-2 viral copies this assay can detect is 250  copies / mL. A negative result does not preclude SARS-CoV-2 infection  and should not be used as the sole basis for treatment or other  patient management decisions.  A negative result may occur with  improper specimen collection / handling, submission of specimen other  than nasopharyngeal swab, presence of viral mutation(s) within the  areas targeted by this assay, and inadequate number of viral copies  (<250 copies / mL). A negative result must be combined with clinical  observations, patient history, and epidemiological information. If result is POSITIVE SARS-CoV-2 target nucleic acids are DETECTED. The SARS-CoV-2 RNA is generally detectable in upper and lower  respiratory specimens dur ing the acute phase of infection.  Positive  results are indicative of active infection with SARS-CoV-2.  Clinical  correlation with patient history and other diagnostic information is  necessary to determine patient infection status.  Positive results do  not rule out bacterial infection or co-infection with other viruses. If result is PRESUMPTIVE POSTIVE SARS-CoV-2 nucleic acids MAY BE PRESENT.   A presumptive positive result was obtained on the submitted specimen  and confirmed on repeat testing.  While 2019 novel coronavirus  (SARS-CoV-2) nucleic acids may be present in the submitted sample  additional confirmatory testing may be necessary for epidemiological  and / or clinical management purposes  to differentiate between  SARS-CoV-2 and other Sarbecovirus currently known to infect humans.  If clinically indicated additional testing with an alternate test  methodology [phone]) is  advised. The SARS-CoV-2 RNA is generally  detectable in upper and lower respiratory sp ecimens during the acute  phase of infection. The expected result is Negative. Fact Sheet for Patients:  BoilerBrush.com.cy Fact Sheet for Healthcare Providers: https://pope.com/ This test is not yet approved or cleared by the [place] FDA and has been authorized for detection and/or diagnosis of SARS-CoV-2 by FDA under an Emergency Use Authorization (EUA).  This EUA will  remain in effect (meaning this test can be used) for the duration of the COVID-19 declaration under Section 564(b)(1) of the Act, 21 U.S.C. section 360bbb-3(b)(1), unless the authorization is terminated or revoked sooner. Performed at [facility], [place] [address], [place], [place] [address]   Urine rapid drug screen (hosp performed)not at [facility]     Status: None   Collection Time: 07/29/19  3:45 PM  Result Value Ref Range   Opiates NONE DETECTED NONE DETECTED   Cocaine NONE DETECTED NONE DETECTED   Benzodiazepines NONE DETECTED NONE DETECTED   Amphetamines NONE DETECTED NONE DETECTED   Tetrahydrocannabinol NONE DETECTED NONE DETECTED   Barbiturates NONE DETECTED NONE DETECTED    Comment: (NOTE) DRUG SCREEN FOR MEDICAL PURPOSES ONLY.  IF CONFIRMATION IS NEEDED FOR ANY PURPOSE, NOTIFY LAB WITHIN 5 DAYS. LOWEST DETECTABLE LIMITS FOR URINE DRUG SCREEN Drug Class                     Cutoff (ng/mL) Amphetamine and metabolites    1000 Barbiturate and metabolites    200 Benzodiazepine                 401 Tricyclics and metabolites     300 Opiates and metabolites        300 Cocaine and metabolites        300 THC                            50 Performed at [facility], [place] [address] [place], [place] [address]   Urinalysis, Complete w Microscopic     Status: Abnormal   Collection Time: 07/29/19  3:45 PM  Result Value Ref Range   Color, Urine YELLOW YELLOW    APPearance CLEAR CLEAR   Specific Gravity, Urine 1.013 1.005 - 1.030   pH 6.0 5.0 - 8.0   Glucose, UA NEGATIVE NEGATIVE mg/dL   Hgb urine dipstick SMALL (A) NEGATIVE   Bilirubin Urine NEGATIVE NEGATIVE   Ketones, ur 20 (A) NEGATIVE mg/dL   Protein, ur NEGATIVE NEGATIVE mg/dL   Nitrite NEGATIVE NEGATIVE   Leukocytes,Ua NEGATIVE NEGATIVE   RBC / HPF 0-5 0 - 5 RBC/hpf   WBC, UA 0-5 0 - 5 WBC/hpf   Bacteria, UA NONE SEEN NONE SEEN   Mucus PRESENT     Comment: Performed at [facility], [place] [address] [place], [place] [address]  Protime-INR     Status: None   Collection Time: 07/29/19  6:08 PM  Result Value Ref Range   Prothrombin Time 13.7 11.4 - 15.2 seconds   INR 1.1 0.8 - 1.2    Comment: (NOTE) INR goal varies based on device and disease states. Performed at [facility], [place] [address], [place], [place] [address]    Ct Angio Head W/cm &/or Wo Cm  Result Date: 07/29/2019 CLINICAL DATA:  Altered mental status since 07/26/2019. Confusion, vomiting, headache EXAM: CT ANGIOGRAPHY HEAD AND NECK TECHNIQUE: Multidetector CT imaging of the head and neck was performed using the standard protocol during bolus administration of intravenous contrast. Multiplanar CT image reconstructions and MIPs were obtained to evaluate the vascular anatomy. Carotid stenosis measurements (when applicable) are obtained utilizing NASCET criteria, using the distal internal carotid diameter as the denominator. CONTRAST:  33mL OMNIPAQUE IOHEXOL 350 MG/ML SOLN COMPARISON:  CTA head and neck 05/05/2019 FINDINGS: CT HEAD FINDINGS Brain: Bilateral subdural hematomas, left greater than right.  Both subdural hematomas have mixed density suggesting predominantly subacute blood products with layering hematocrit level on the left. Left-sided subdural hematoma measures 18 mm in thickness, right-sided subdural hematoma measures 7 mm in thickness. There is significant mass-effect on the left hemisphere with 11 mm midline  shift to the right. Left ventricle compressed. No hydrocephalus. There is increased intracranial pressure with uncal herniation bilaterally left greater than right. No subarachnoid or parenchymal hemorrhage. No acute ischemic infarct. Vascular: Negative for hyperdense vessel Skull: Negative Sinuses: Negative Orbits: Negative Review of the MIP images confirms the above findings CTA NECK FINDINGS Aortic arch: Standard branching. Imaged portion shows no evidence of aneurysm or dissection. No significant stenosis of the major arch vessel origins. Right carotid system: Right carotid widely patent. Negative for significant atherosclerotic disease or dissection Left carotid system: Left carotid widely patent. Mild atherosclerotic calcification left carotid bulb Vertebral arteries: Both vertebral arteries widely patent without stenosis. Skeleton: No acute abnormality. Other neck: Negative Upper chest: Lung apices clear bilaterally. Mild apical scarring bilaterally. Review of the MIP images confirms the above findings CTA HEAD FINDINGS Anterior circulation: Cavernous carotid widely patent without significant stenosis. Anterior and middle cerebral arteries widely patent bilaterally without stenosis. Significant midline shift to the right due to subdural hematoma on the left. Posterior circulation: Both vertebral arteries widely patent to the basilar. PICA patent bilaterally. Basilar widely patent. Superior cerebellar and posterior cerebral arteries patent bilaterally. Venous sinuses: Patent Anatomic variants: None Review of the MIP images confirms the above findings IMPRESSION: 1. Bilateral subacute subdural hematomas. 7 mm thick right subdural hematoma and 18 mm subdural hematoma on the left. There is mass-effect on the left cerebral hemisphere with 11 mm midline shift to the right. There is bilateral uncal herniation left greater than right due to increased intracranial pressure. Correlate with trauma history. Correlate  with coagulopathy. 2. Essentially negative CTA head neck without significant stenosis or vascular malformation. 3. These results were called by telephone at the time of interpretation on 07/29/2019 at 5:40 pm to Dr. Virgina NorfolkADAM CURATOLO , who verbally acknowledged these results. Electronically Signed   By: Marlan Palauharles  Clark M.D.   On: 07/29/2019 17:50   Ct Angio Neck W Or Wo Contrast  Result Date: 07/29/2019 CLINICAL DATA:  Altered mental status since 07/26/2019. Confusion, vomiting, headache EXAM: CT ANGIOGRAPHY HEAD AND NECK TECHNIQUE: Multidetector CT imaging of the head and neck was performed using the standard protocol during bolus administration of intravenous contrast. Multiplanar CT image reconstructions and MIPs were obtained to evaluate the vascular anatomy. Carotid stenosis measurements (when applicable) are obtained utilizing NASCET criteria, using the distal internal carotid diameter as the denominator. CONTRAST:  75mL OMNIPAQUE IOHEXOL 350 MG/ML SOLN COMPARISON:  CTA head and neck 05/05/2019 FINDINGS: CT HEAD FINDINGS Brain: Bilateral subdural hematomas, left greater than right. Both subdural hematomas have mixed density suggesting predominantly subacute blood products with layering hematocrit level on the left. Left-sided subdural hematoma measures 18 mm in thickness, right-sided subdural hematoma measures 7 mm in thickness. There is significant mass-effect on the left hemisphere with 11 mm midline shift to the right. Left ventricle compressed. No hydrocephalus. There is increased intracranial pressure with uncal herniation bilaterally left greater than right. No subarachnoid or parenchymal hemorrhage. No acute ischemic infarct. Vascular: Negative for hyperdense vessel Skull: Negative Sinuses: Negative Orbits: Negative Review of the MIP images confirms the above findings CTA NECK FINDINGS Aortic arch: Standard branching. Imaged portion shows no evidence of aneurysm or dissection. No significant stenosis of  the major  arch vessel origins. Right carotid system: Right carotid widely patent. Negative for significant atherosclerotic disease or dissection Left carotid system: Left carotid widely patent. Mild atherosclerotic calcification left carotid bulb Vertebral arteries: Both vertebral arteries widely patent without stenosis. Skeleton: No acute abnormality. Other neck: Negative Upper chest: Lung apices clear bilaterally. Mild apical scarring bilaterally. Review of the MIP images confirms the above findings CTA HEAD FINDINGS Anterior circulation: Cavernous carotid widely patent without significant stenosis. Anterior and middle cerebral arteries widely patent bilaterally without stenosis. Significant midline shift to the right due to subdural hematoma on the left. Posterior circulation: Both vertebral arteries widely patent to the basilar. PICA patent bilaterally. Basilar widely patent. Superior cerebellar and posterior cerebral arteries patent bilaterally. Venous sinuses: Patent Anatomic variants: None Review of the MIP images confirms the above findings IMPRESSION: 1. Bilateral subacute subdural hematomas. 7 mm thick right subdural hematoma and 18 mm subdural hematoma on the left. There is mass-effect on the left cerebral hemisphere with 11 mm midline shift to the right. There is bilateral uncal herniation left greater than right due to increased intracranial pressure. Correlate with trauma history. Correlate with coagulopathy. 2. Essentially negative CTA head neck without significant stenosis or vascular malformation. 3. These results were called by telephone at the time of interpretation on 07/29/2019 at 5:40 pm to Dr. Virgina NorfolkADAM CURATOLO , who verbally acknowledged these results. Electronically Signed   By: Marlan Palauharles  Clark M.D.   On: 07/29/2019 17:50   Dg Chest Port 1 View  Result Date: 07/29/2019 CLINICAL DATA:  Altered mental status EXAM: PORTABLE CHEST 1 VIEW COMPARISON:  None. FINDINGS: Lungs are clear. Heart size and  pulmonary vascularity are normal. No adenopathy. No pneumothorax. No bone lesions. IMPRESSION: No edema or consolidation. Electronically Signed   By: Bretta BangWilliam  Woodruff III M.D.   On: 07/29/2019 14:16    Pertinent items noted in HPI and remainder of comprehensive ROS otherwise negative.  Blood pressure 126/83, pulse (!) 59, temperature 98.5 F (36.9 C), temperature source Oral, resp. rate 18, SpO2 100 %.  Patient is awake and alert.  He is oriented and reasonably appropriate.  His speech is somewhat halting with poor content.  He appears to understand well.  He follows commands readily.  Cranial nerve function finds his pupils to be equal and round and reactive bilaterally.  Gaze conjugate.  Facial movement and sensation with some mild central weakness on the right.  Tongue protrudes mildly toward the right.  Motor examination extremities reveals slight weakness of his right upper and lower extremities indicated by a right-sided pronator drift.  Reflexes are normal.  Sensory examination nonfocal.  Examination head ears eyes nose and throat is unremarked.  Chest and abdomen are benign.  Extremities are free from injury or deformity. 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  Patient with a small right convexity chronic subdural hematoma with minimal if any mass-effect.  I discussed situation with the patient.  His wife currently is unavailable.  I have recommended that we move forward emergently with left-sided bur hole evacuation of chronic subdural hematoma.  I discussed the risks involved with surgery including but not limited to the risk of anesthesia, bleeding, infection, brain injury including stroke coma seizure and even death.  I explained the risk of hemorrhage recurrence and need for possible additional surgery.  The patient appears to understand.  He wishes to proceed.  Kathaleen MaserHenry A Saraphina Lauderbaugh 07/29/2019, 6:47 PM

## 2019-07-29 NOTE — Op Note (Signed)
Date of procedure: 07/29/2019 history  Date of dictation: Same  Service: Neurosurgery  Preoperative diagnosis: Chronic left convexity subdural hematoma  Postoperative diagnosis: Same  Procedure Name: Left bur hole evacuation of subdural hematoma, placement of subdural drain  Surgeon:Forrester Blando A.Zurich Carreno, M.D.  Asst. Surgeon: Reinaldo Meeker, NP  Anesthesia: General  Indication: 53 year old male with progressive headaches and confusion with aphasia.  Work-up demonstrates evidence of a large left convexity chronic appearing subdural hematoma with significant mass-effect.  Patient also with a small right convexity chronic subdural hematoma.  Patient presents now for bur hole evacuation of left-sided subdural hematoma.  Risks and benefits were explained.  Patient wishes to proceed.  Operative note: After induction of anesthesia, patient position supine with head turned toward the right.  Patient's left scalp prepped and draped sterilely.  A left frontal and left parietal incision was then made.  This carried down sharply to the pericranium.  Self-retaining tractors were placed.  Bur holes were then made using high-speed drill.  Dura was coagulated and then incised.  Dural leaflets were burned back to the edges of the bur holes.  Subdural hematoma which was liquefied and chronic appearing drained out under pressure.  This was irrigated clean.  There was no evidence of active hemorrhage.  A 10 mm Blake drain was then passed from the parietal hole under visualization past the frontal hole.  This was exited through a separate stab incision.  The wounds were then closed in typical fashion.  Sterile dressing was applied.  No apparent complications.  Patient tolerated procedure well he returns to the recovery room postop.

## 2019-07-29 NOTE — Transfer of Care (Signed)
Immediate Anesthesia Transfer of Care Note  Patient: John Michael  Procedure(s) Performed: Georganna Skeans FOR EVACUATION OF SUBDURAL HEMATOMA (Left Scalp)  Patient Location: PACU  Anesthesia Type:General  Level of Consciousness: drowsy  Airway & Oxygen Therapy: Patient Spontanous Breathing and Patient connected to face mask oxygen  Post-op Assessment: Report given to RN and Post -op Vital signs reviewed and stable  Post vital signs: Reviewed and stable  Last Vitals:  Vitals Value Taken Time  BP 126/75 07/29/19 2054  Temp 36.9 C 07/29/19 2054  Pulse 66 07/29/19 2054  Resp 15 07/29/19 2054  SpO2 100 % 07/29/19 2054  Vitals shown include unvalidated device data.  Last Pain:  Vitals:   07/29/19 1252  TempSrc: Oral  PainSc: 0-No pain      Patients Stated Pain Goal: 0 ([id number])  Complications: No apparent anesthesia complications

## 2019-07-29 NOTE — ED Provider Notes (Signed)
[facility]   CSN: [id number] Arrival date & time: 07/29/19  1245    History   Chief Complaint Chief Complaint  Patient presents with  . Altered Mental Status    HPI John Michael is a 53 y.o. male.     HPI Patient presents with his wife who provides the history. Patient is encephalopathic, attempts to answer questions appropriately, but is disoriented, incapable of providing details of his HPI. Level 5 caveat.  Patient's wife notes that 6 days ago the patient was lifting a heavy object.  He complained of head and neck pain at that time, but subsequently has had no pain. However, since that time he has had increasing discoordination, disorientation, persistent nausea. This is been worsening over the interval, including multiple episodes of vomiting.  Patient did attempt to drive a car, but after 30 minutes stopped due to nausea, vomiting, difficulty completing the task. Patient has had lapses towards normalcy but in general his progression over the past few days has been towards complete encephalopathy. Wife notes that the patient is generally well takes no medication regularly. He does drink alcohol occasionally, reportedly.  History reviewed. No pertinent past medical history.  There are no active problems to display for this patient.   History reviewed. No pertinent surgical history.      Home Medications    Prior to Admission medications   Medication Sig Start Date End Date Taking? Authorizing Provider  diclofenac (CATAFLAM) 50 MG tablet Take 50 mg by mouth 3 (three) times daily. 05/04/19   [provider]  methocarbamol (ROBAXIN) 750 MG tablet Take 750 mg by mouth 3 (three) times daily. 05/04/19   [provider]  Multiple Vitamin (MULTIVITAMIN WITH MINERALS) TABS tablet Take 1 tablet by mouth daily.    [provider]    Family History History reviewed. No pertinent family history.  Social History Social History   Tobacco Use  . Smoking status: Not on file  Substance Use Topics  . Alcohol use: Yes  . Drug use: Never     Allergies   Patient has no known allergies.   Review of Systems Review of Systems  Unable to perform ROS: Mental status change     Physical Exam Updated Vital Signs BP 128/81   Pulse 65   Temp 98.5 F (36.9 C) (Oral)   Resp 18   SpO2 99%   Physical Exam Vitals signs reviewed.  Constitutional:      Comments: Withdrawn thin adult male  Cardiovascular:     Rate and Rhythm: Normal rate.  Pulmonary:     Effort: Pulmonary effort is normal. No respiratory distress.     Breath sounds: Normal breath sounds.  Abdominal:     General: There is no distension.     Tenderness: There is no abdominal tenderness. There is no guarding.  Musculoskeletal:        General: No deformity.  Skin:    General: Skin is warm and dry.  Neurological:     Mental Status: He is alert. He is disoriented and confused.     Motor: No tremor.     Comments: globally slow responses to all commands, with no gross asymmetry. Patient has difficulty following commands to completion.  On however he does move all extremities spontaneously and to command.      ED Treatments / Results  Labs (all labs ordered are listed, but only abnormal results are displayed) Labs Reviewed  COMPREHENSIVE METABOLIC PANEL -  Abnormal; Notable for the following components:      Result Value   Glucose, Bld 106 (*)    BUN 23 (*)    All other components within normal limits  AMMONIA - Abnormal; Notable for the following components:   Ammonia <9 (*)    All other components within normal limits  CBG MONITORING, ED - Abnormal; Notable for the following components:   Glucose-Capillary 106 (*)    All other components within normal limits  SARS CORONAVIRUS 2 (HOSPITAL ORDER, [place] [facility])  ETHANOL  CBC WITH DIFFERENTIAL/PLATELET  LACTIC ACID, PLASMA  RAPID  URINE DRUG SCREEN, HOSP PERFORMED  URINALYSIS, COMPLETE (UACMP) WITH MICROSCOPIC  CBG MONITORING, ED    EKG EKG Interpretation  Date/Time:  Tuesday July 29 2019 12:51:38 EDT Ventricular Rate:  59 PR Interval:    QRS Duration: 81 QT Interval:  424 QTC Calculation: 420 R Axis:   78 Text Interpretation:  Sinus rhythm Abnormal R-wave progression, early transition ST elevation, consider anterior injury Confirmed by Veryl Speak [phone]) on 07/29/2019 1:05:39 PM Also confirmed by Veryl Speak [phone]), editor Philomena Doheny [phone])  on 07/29/2019 1:31:30 PM   Radiology Dg Chest Port 1 View  Result Date: 07/29/2019 CLINICAL DATA:  Altered mental status EXAM: PORTABLE CHEST 1 VIEW COMPARISON:  None. FINDINGS: Lungs are clear. Heart size and pulmonary vascularity are normal. No adenopathy. No pneumothorax. No bone lesions. IMPRESSION: No edema or consolidation. Electronically Signed   By: Lowella Grip III M.D.   On: 07/29/2019 14:16    Procedures Procedures (including critical care time)  Medications Ordered in ED Medications  sodium chloride 0.9 % bolus 1,000 mL (1,000 mLs Intravenous New Bag/Given 07/29/19 1332)    And  0.9 %  sodium chloride infusion ( Intravenous New Bag/Given 07/29/19 1449)     Initial Impression / Assessment and Plan / ED Course  I have reviewed the triage vital signs and the nursing notes.  Pertinent labs & imaging results that were available during my care of the patient were reviewed by me and considered in my medical decision making (see chart for details).        3:36 PM Labs thus far are generally reassuring, no notable abnormalities. X-ray reassuring, patient remains hemodynamically unremarkable, afebrile, with no blood pressure abnormalities. He is slightly better than on the initial evaluation, but given concern for encephalopathy like presentation I discussed this case with our neurology colleagues, and the patient is awaiting CT imaging for  consideration of infection versus encephalopathy versus vascular injury or mass. Dr. Ronnald Nian is aware of the patient.  Final Clinical Impressions(s) / ED Diagnoses   Final diagnoses:  Delirium     Carmin Muskrat, MD 07/29/19 1537

## 2019-07-29 NOTE — ED Provider Notes (Signed)
Patient signed out to me awaiting CT scan of his head.  Has had altered mental status for the last several days.  No specific trauma.  Exam is overall nonfocal.  Neurology has been consulted as patient has had altered mental status with no obvious cause.  CT scan shows large subdural hematoma with midline shift.  Neurosurgery was consulted and will go to the OR with the patient for evacuation.  NSU to eval in the ED, talked with radiology on the phone about CT findings. Hemodynamically stable throughout my care.  Patient is not on blood thinners.  No specific trauma. COVID test is negative.   This chart was dictated using voice recognition software.  Despite best efforts to proofread,  errors can occur which can change the documentation meaning.  .Critical Care Performed by: Lennice Sites, DO Authorized by: Lennice Sites, DO   Critical care provider statement:    Critical care time (minutes):  35   Critical care was necessary to treat or prevent imminent or life-threatening deterioration of the following conditions:  CNS failure or compromise   Critical care was time spent personally by me on the following activities:  Blood draw for specimens, development of treatment plan with patient or surrogate, discussions with primary provider, evaluation of patient's response to treatment, examination of patient, obtaining history from patient or surrogate, ordering and performing treatments and interventions, ordering and review of laboratory studies, ordering and review of radiographic studies, pulse oximetry, re-evaluation of patient's condition and review of old charts   I assumed direction of critical care for this patient from another provider in my specialty: yes        Lennice Sites, DO 07/29/19 1827

## 2019-07-29 NOTE — ED Triage Notes (Addendum)
Pt arrives to ED from his PCP office with complaints of AMS since August 15th when he took a trip to [place] to move daughter into college. Pt started to become confused, had an episode of emesis, and had a hard time walking a straight line. Pt is only oriented to name on arrival to ED. Per EMS pt had bad headache earlier in the week that started on August 12th when carrying a heavy box downstairs. Pt is currently lethargic and A&O x1.

## 2019-07-29 NOTE — ED Notes (Signed)
Called CT to check on pt status for scans. Pt is next in line

## 2019-07-29 NOTE — Anesthesia Procedure Notes (Signed)
Procedure Name: Intubation Date/Time: 07/29/2019 8:15 PM Performed by: Clovis Cao, CRNA Pre-anesthesia Checklist: Patient identified, Emergency Drugs available, Suction available, Patient being monitored and Timeout performed Patient Re-evaluated:Patient Re-evaluated prior to induction Oxygen Delivery Method: Circle system utilized Preoxygenation: Pre-oxygenation with 100% oxygen Induction Type: Rapid sequence, IV induction and Cricoid Pressure applied Laryngoscope Size: Miller and 2 Grade View: Grade I Tube type: Oral Tube size: 8.0 mm Number of attempts: 1 Airway Equipment and Method: Stylet Placement Confirmation: ETT inserted through vocal cords under direct vision,  positive ETCO2 and breath sounds checked- equal and bilateral Secured at: 22 cm Tube secured with: Tape Dental Injury: Teeth and Oropharynx as per pre-operative assessment

## 2019-07-30 ENCOUNTER — Inpatient Hospital Stay (HOSPITAL_COMMUNITY): Payer: 59

## 2019-07-30 ENCOUNTER — Encounter (HOSPITAL_COMMUNITY): Payer: Self-pay | Admitting: Neurosurgery

## 2019-07-30 DIAGNOSIS — R410 Disorientation, unspecified: Secondary | ICD-10-CM

## 2019-07-30 LAB — CBC
HCT: 35.6 % — ABNORMAL LOW (ref 39.0–52.0)
Hemoglobin: 12 g/dL — ABNORMAL LOW (ref 13.0–17.0)
MCH: 30.7 pg (ref 26.0–34.0)
MCHC: 33.7 g/dL (ref 30.0–36.0)
MCV: 91 fL (ref 80.0–100.0)
Platelets: 223 10*3/uL (ref 150–400)
RBC: 3.91 MIL/uL — ABNORMAL LOW (ref 4.22–5.81)
RDW: 11.9 % (ref 11.5–15.5)
WBC: 9.3 10*3/uL (ref 4.0–10.5)
nRBC: 0 % (ref 0.0–0.2)

## 2019-07-30 LAB — BASIC METABOLIC PANEL
Anion gap: 7 (ref 5–15)
BUN: 13 mg/dL (ref 6–20)
CO2: 23 mmol/L (ref 22–32)
Calcium: 8.4 mg/dL — ABNORMAL LOW (ref 8.9–10.3)
Chloride: 109 mmol/L (ref 98–111)
Creatinine, Ser: 0.78 mg/dL (ref 0.61–1.24)
GFR calc Af Amer: >60 mL/min (ref >60)
GFR calc non Af Amer: >60 mL/min (ref >60)
Glucose, Bld: 83 mg/dL (ref 70–99)
Potassium: 3.8 mmol/L (ref 3.5–5.1)
Sodium: 139 mmol/L (ref 135–145)

## 2019-07-30 MED ORDER — PANTOPRAZOLE SODIUM 40 MG PO TBEC
40.0000 mg | DELAYED_RELEASE_TABLET | Freq: Every day | ORAL | Status: DC
  Administered 2019-07-30 – 2019-07-31 (×2): 40 mg via ORAL
  Filled 2019-07-30 (×2): qty 1

## 2019-07-30 NOTE — Progress Notes (Signed)
EEG complete - results pending 

## 2019-07-30 NOTE — Procedures (Signed)
Patient Name: John Michael  MRN: [id number]  Epilepsy Attending: Lora Havens  Referring Physician/Provider: Dr Earnie Larsson Date: 07/30/2019 Duration: 25.46 mins  Patient history: Patient with bilateral subdural hematoma.  EEG ordered to evaluate for seizures.  Level of alertness: Awake and drowsy  AEDs during EEG study: Levetiracetam 500mg  twice daily  Technical aspects: This EEG study was done with scalp electrodes positioned according to the 10-20 International system of electrode placement. Electrical activity was acquired at a sampling rate of 500Hz  and reviewed with a high frequency filter of 70Hz  and a low frequency filter of 1Hz . EEG data were recorded continuously and digitally stored.   Description:The posterior dominant rhythm consists of 9-10 Hz activity of moderate voltage (25-35 uV) seen predominantly in posterior head regions, symmetric and reactive to eye opening and eye closing. Drowsiness was characterized by attenuation of the posterior background rhythm.  There was also intermittent 2 to 3 Hz delta slowing seen in left and right frontotemporal region. Hyperventilation and photic stimulation were not performed.  IMPRESSION: This study is suggestive of cortical dysfunction in left and right frontotemporal region which is likely secondary to underlying subdural hematoma. No seizures or epileptiform discharges were seen throughout the recording  However, only wakefulness and drowsiness were recorded. If suspicion for interictal activity remains a concern, a prolonged study including sleep should be considered.   [name] Barbra Sarks

## 2019-07-30 NOTE — Progress Notes (Signed)
Postop day 1.  Patient overall doing well.  No headache.  Speech much improved.  Confusion resolved.  Afebrile.  Vital signs are stable.  Patient awake and alert.  He is oriented and appropriate.  His speech is fluent.  His judgment insight appear intact.  Motor and sensory examination intact bilaterally.  Wound clean and dry.  Drain output moderate.  Follow-up head CT scan with resolution of left convexity chronic subdural hematoma.  Still with some left frontal pneumocephalus.  Right subdural collection stable.  Overall doing well.  Begin to mobilize.  Check follow-up head CT scan tomorrow.  Likely pull drain tomorrow.

## 2019-07-31 ENCOUNTER — Encounter (HOSPITAL_COMMUNITY): Payer: Self-pay | Admitting: *Deleted

## 2019-07-31 ENCOUNTER — Inpatient Hospital Stay (HOSPITAL_COMMUNITY): Payer: 59

## 2019-07-31 MED ORDER — LEVETIRACETAM 500 MG PO TABS
500.0000 mg | ORAL_TABLET | Freq: Two times a day (BID) | ORAL | Status: DC
  Administered 2019-07-31 – 2019-08-01 (×3): 500 mg via ORAL
  Filled 2019-07-31 (×4): qty 1

## 2019-07-31 NOTE — Progress Notes (Signed)
Pt transferred to 3W 12. Wife at bedside. RN Sam aware of pts arrival.

## 2019-07-31 NOTE — Plan of Care (Signed)
  Problem: Education: Goal: Knowledge of General Education information will improve Description: Including pain rating scale, medication(s)/side effects and non-pharmacologic comfort measures Outcome: Progressing   Problem: Health Behavior/Discharge Planning: Goal: Ability to manage health-related needs will improve Outcome: Progressing   Problem: Clinical Measurements: Goal: Ability to maintain clinical measurements within normal limits will improve Outcome: Progressing Goal: Will remain free from infection Outcome: Progressing Goal: Diagnostic test results will improve Outcome: Progressing Goal: Respiratory complications will improve Outcome: Progressing Goal: Cardiovascular complication will be avoided Outcome: Progressing   Problem: Activity: Goal: Risk for activity intolerance will decrease Outcome: Progressing   Problem: Nutrition: Goal: Adequate nutrition will be maintained Outcome: Progressing   Problem: Coping: Goal: Level of anxiety will decrease Outcome: Progressing   Problem: Elimination: Goal: Will not experience complications related to bowel motility Outcome: Progressing Goal: Will not experience complications related to urinary retention Outcome: Progressing   Problem: Pain Managment: Goal: General experience of comfort will improve Outcome: Progressing   Problem: Safety: Goal: Ability to remain free from injury will improve Outcome: Progressing   Problem: Skin Integrity: Goal: Risk for impaired skin integrity will decrease Outcome: Progressing   Problem: Education: Goal: Knowledge of disease or condition will improve Outcome: Progressing Goal: Knowledge of secondary prevention will improve Outcome: Progressing Goal: Individualized Educational Video(s) Outcome: Progressing   Problem: Coping: Goal: Will verbalize positive feelings about self Outcome: Progressing Goal: Will identify appropriate support needs Outcome: Progressing   Ival Bible, BSN, RN

## 2019-07-31 NOTE — Progress Notes (Signed)
Postop day 2.  Patient bright and awake this morning.  Speech fluent.  Content good.  No obvious confusion.  Afebrile.  Vital signs are stable.  Awake and alert.  Oriented x4.  Speech fluent.  Motor 5/5.  No drift.  Wounds clean and dry.  Drain removed.  Follow-up head CT scan still with some left frontal air.  No evidence of residual hematoma.  No mass-effect.  Right convexity subdural hematoma stable.  Overall doing well.  Mobilize today with the drain out.  Probable discharge home tomorrow.

## 2019-08-01 NOTE — Discharge Summary (Signed)
Physician Discharge Summary  Patient ID: John Michael MRN: [id number] DOB/AGE: 04-11-1966 53 y.o.  Admit date: 07/29/2019 Discharge date: 08/01/2019  Admission Diagnoses:  Discharge Diagnoses:  Active Problems:   SDH (subdural hematoma) [facility])   Discharged Condition: good  Hospital Course: Patient admitted to the hospital for treatment of a large chronic left-sided subdural hematoma.  Patient taken the operating room where he underwent uncomplicated left-sided bur hole evacuation of subdural hematoma.  Postoperatively the patient awake and much improved.  His speech returned to normal.  His right-sided weakness resolved.  He had no headaches.  Follow-up serial head CT scans demonstrated resolution of the left convexity subdural hematoma.  He still has a small right convexity chronic subdural hematoma with no mass-effect.  The patient's drain has been removed.  He is ambulating talking and voiding without difficulty.  He is ready for discharge home.  Consults:   Significant Diagnostic Studies:   Treatments:   Discharge Exam: Blood pressure 102/78, pulse 74, temperature (!) 97.5 F (36.4 C), temperature source Oral, resp. rate 12, SpO2 97 %. Awake and alert.  Oriented and appropriate.  Speech is fluent.  Judgment insight are intact.  Motor and sensory function extremities normal.  Cranial nerve function normal bilateral.  Chest and abdomen benign.  Disposition: Discharge disposition: 01-Home or Self Care        Allergies as of 08/01/2019   No Known Allergies     Medication List    TAKE these medications   acetaminophen 500 MG tablet Commonly known as: TYLENOL Take 500 mg by mouth every 6 (six) hours as needed for moderate pain or headache.   ibuprofen 200 MG tablet Commonly known as: ADVIL Take 400 mg by mouth every 6 (six) hours as needed for headache or moderate pain.   multivitamin with minerals Tabs tablet Take 1 tablet by mouth daily.        Signed: Cooper Render  Mailani Degroote 08/01/2019, 12:46 PM

## 2019-08-01 NOTE — Evaluation (Signed)
Occupational Therapy Evaluation Patient Details Name: John LauraZhijian Trull MRN: [id number] DOB: 27-Jan-1966 Today's Date: 08/01/2019    History of Present Illness 53 year old male admitted with headache and increasing aphasia.  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  Patient with a small right convexity chronic subdural hematoma with minimal if any mass-effect.  Now s/p Left bur hole evacuation of subdural hematoma, placement of subdural drain on 8/18.   Clinical Impression   Pt admitted with above diagnoses, functioning very close to baseline level of BADL. 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 At time of evaluation he is ind with all transfers, no BUE weakness/sensation deficits noted, no visual issues. Pt balance was intact throughout session. He was able to complete successful topographical orientation while alternating attention. Wife present in session and asking for education on warning signs when to return to ER for worsening neuro symptoms. At this time no f/u OT indicated. OT will sign off.    Follow Up Recommendations  No OT follow up    Equipment Recommendations  None recommended by OT    Recommendations for Other Services       Precautions / Restrictions Precautions Precautions: None Restrictions Weight Bearing Restrictions: No      Mobility Bed Mobility Overal bed mobility: Independent                Transfers Overall transfer level: Independent                    Balance Overall balance assessment: No apparent balance deficits (not formally assessed)                                         ADL either performed or assessed with clinical judgement   ADL Overall ADL's : Needs assistance/impaired Eating/Feeding: Independent   Grooming: Independent   Upper Body Bathing: Set up;Sitting;Standing   Lower Body Bathing: Supervison/ safety;Sit to/from stand;Sitting/lateral leans   Upper  Body Dressing : Modified independent Upper Body Dressing Details (indicate cue type and reason): dressed in own clothing upon arrival Lower Body Dressing: Modified independent   Toilet Transfer: Supervision/safety;Regular Toilet   Toileting- Clothing Manipulation and Hygiene: Supervision/safety   Tub/ Engineer, structuralhower Transfer: Supervision/safety   Functional mobility during ADLs: Supervision/safety;Modified independent General ADL Comments: pt presenting very close to baseline BADL     Vision Baseline Vision/History: Wears glasses Wears Glasses: At all times Patient Visual Report: No change from baseline Vision Assessment?: Yes Tracking/Visual Pursuits: Able to track stimulus in all quads without difficulty     Perception     Praxis      Pertinent Vitals/Pain Pain Assessment: No/denies pain     Hand Dominance     Extremity/Trunk Assessment Upper Extremity Assessment Upper Extremity Assessment: Overall WFL for tasks assessed   Lower Extremity Assessment Lower Extremity Assessment: Overall WFL for tasks assessed       Communication Communication Communication: No difficulties   Cognition Arousal/Alertness: Awake/alert Behavior During Therapy: WFL for tasks assessed/performed Overall Cognitive Status: Within Functional Limits for tasks assessed                                 General Comments: able to find room and topographically orient while being asked other questions   General Comments       Exercises  Shoulder Instructions      Home Living Family/patient expects to be discharged to:: Private residence Living Arrangements: Spouse/significant other Available Help at Discharge: Family Type of Home: House Home Access: Stairs to enter Technical brewer of Steps: 2   Home Layout: Two level;Bed/bath upstairs Alternate Level Stairs-Number of Steps: 12 Alternate Level Stairs-Rails: Left Bathroom Shower/Tub: Walk-in shower         Home  Equipment: None          Prior Functioning/Environment Level of Independence: Independent        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, drilling        OT Problem List: Decreased knowledge of use of DME or AE;Decreased activity tolerance;Impaired balance (sitting and/or standing)      OT Treatment/Interventions:      OT Goals(Current goals can be found in the care plan section) Acute Rehab OT Goals Patient Stated Goal: to return to work as able OT Goal Formulation: With patient/family Time For Goal Achievement: 08/15/19 Potential to Achieve Goals: Good  OT Frequency:     Barriers to D/C:            Co-evaluation              AM-PAC OT "6 Clicks" Daily Activity     Outcome Measure Help from another person eating meals?: None Help from another person taking care of personal grooming?: None Help from another person toileting, which includes using toliet, bedpan, or urinal?: None Help from another person bathing (including washing, rinsing, drying)?: None Help from another person to put on and taking off regular upper body clothing?: None Help from another person to put on and taking off regular lower body clothing?: None 6 Click Score: 24   End of Session Nurse Communication: Mobility status  Activity Tolerance: Patient tolerated treatment well Patient left: in bed;with call bell/phone within reach;with family/visitor present  OT Visit Diagnosis: Other symptoms and signs involving the nervous system (Z61.096)                Time: 0454-0981 OT Time Calculation (min): 13 min Charges:  OT General Charges $OT Visit: 1 Visit OT Evaluation $OT Eval Low Complexity: 1 Low  Zenovia Jarred, MSOT, OTR/L [place] OT/ Acute Relief OT [facility] Office: [place] 08/01/2019, 3:42 PM

## 2019-08-01 NOTE — TOC Transition Note (Signed)
Transition of Care [facility]) - CM/SW Discharge Note   Patient Details  Name: John Michael MRN: [id number] Date of Birth: [birth year]  [facility] CM/SW Contact:  Pollie Friar, RN Phone Number: 08/01/2019, 12:53 PM   Clinical Narrative:    Pt is discharging home with family. No f/u per PT/OT and no DME needs.  He has hospital follow up, insurance and transportation home.   Final next level of care: Home/Self Care Barriers to Discharge: No Barriers Identified   Patient Goals and CMS Choice        Discharge Placement                       Discharge Plan and Services                                     Social Determinants of Health (SDOH) Interventions     Readmission Risk Interventions No flowsheet data found.

## 2019-08-01 NOTE — Discharge Instructions (Signed)

## 2019-08-01 NOTE — Evaluation (Signed)
Physical Therapy Evaluation Patient Details Name: John Michael MRN: [id number] DOB: [birth year] Today's Date: 08/01/2019   History of Present Illness  53 year old male admitted with headache and increasing aphasia.  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  Patient with a small right convexity chronic subdural hematoma with minimal if any mass-effect.  Now s/p Left bur hole evacuation of subdural hematoma, placement of subdural drain on 8/18.  Clinical Impression  Patient presents with mobility likely close to baseline and independent.  Scored 24/24 on DGI and 55/56 on Berg.  Feel no further skilled PT needs at this time.  Deferred to MD patient's questions regarding exercise.  Will sign off.     Follow Up Recommendations No PT follow up    Equipment Recommendations  None recommended by PT    Recommendations for Other Services       Precautions / Restrictions Precautions Precautions: None      Mobility  Bed Mobility Overal bed mobility: Independent                Transfers Overall transfer level: Independent                  Ambulation/Gait Ambulation/Gait assistance: Independent Gait Distance (Feet): 200 Feet Assistive device: None Gait Pattern/deviations: WFL(Within Functional Limits)        Stairs Stairs: Yes Stairs assistance: Independent Stair Management: No rails;Alternating pattern;Forwards Number of Stairs: 5(x 3)    Wheelchair Mobility    Modified Rankin (Stroke Patients Only)       Balance                                 Standardized Balance Assessment Standardized Balance Assessment : Dynamic Gait Index;Berg Balance Test Berg Balance Test Sit to Stand: Able to stand without using hands and stabilize independently Standing Unsupported: Able to stand safely 2 minutes Sitting with Back Unsupported but Feet Supported on Floor or Stool: Able to sit safely and securely 2  minutes Stand to Sit: Sits safely with minimal use of hands Transfers: Able to transfer safely, minor use of hands Standing Unsupported with Eyes Closed: Able to stand 10 seconds safely Standing Ubsupported with Feet Together: Able to place feet together independently and stand 1 minute safely From Standing, Reach Forward with Outstretched Arm: Can reach confidently >25 cm (10") From Standing Position, Pick up Object from Floor: Able to pick up shoe safely and easily From Standing Position, Turn to Look Behind Over each Shoulder: Looks behind from both sides and weight shifts well Turn 360 Degrees: Able to turn 360 degrees safely in 4 seconds or less Standing Unsupported, Alternately Place Feet on Step/Stool: Able to stand independently and safely and complete 8 steps in 20 seconds Standing Unsupported, One Foot in Front: Able to place foot tandem independently and hold 30 seconds Standing on One Leg: Able to lift leg independently and hold 5-10 seconds Total Score: 55 Dynamic Gait Index Level Surface: Normal Change in Gait Speed: Normal Gait with Horizontal Head Turns: Normal Gait with Vertical Head Turns: Normal Gait and Pivot Turn: Normal Step Over Obstacle: Normal Step Around Obstacles: Normal Steps: Normal Total Score: 24       Pertinent Vitals/Pain Pain Assessment: No/denies pain    Home Living Family/patient expects to be discharged to:: Private residence Living Arrangements: Spouse/significant other Available Help at Discharge: Family Type of Home: House Home Access: Stairs to enter  Entrance Stairs-Number of Steps: 2 Home Layout: Two level;Bed/bath upstairs Home Equipment: None      Prior Function Level of Independence: Independent         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  Pt.questioning what type of exercise he can do.  Deferred to MD due to recent SDH now s/p burr hole evacuation.    Exercises     Assessment/Plan    PT Assessment    PT Problem List         PT Treatment Interventions      PT Goals (Current goals can be found in the Care Plan section)  Acute Rehab PT Goals PT Goal Formulation: All assessment and education complete, DC therapy    Frequency     Barriers to discharge        Co-evaluation               AM-PAC PT "6 Clicks" Mobility  Outcome Measure Help needed turning from your back to your side while in a flat bed without using bedrails?: None Help needed moving from lying on your back to sitting on the side of a flat bed without using bedrails?: None Help needed moving to and from a bed to a chair (including a wheelchair)?: None Help needed standing up from a chair using your arms (e.g., wheelchair or bedside chair)?: None Help needed to walk in hospital room?: None Help needed climbing 3-5 steps with a railing? : None 6 Click Score: 24    End of Session   Activity Tolerance: Patient tolerated treatment well Patient left: in bed;with family/visitor present   PT Visit Diagnosis: Other symptoms and signs involving the nervous system (R29.898)    Time: [id number] PT Time Calculation (min) (ACUTE ONLY): 24 min   Charges:   PT Evaluation $PT Eval Low Complexity: 1 Low PT Treatments $Gait Training: 8-22 mins        Sheran LawlessCyndi Promise Weldin, PT Acute Rehabilitation  Services [phone] 08/01/2019   [name] 08/01/2019, 11:06 AM

## 2019-08-13 ENCOUNTER — Other Ambulatory Visit: Payer: Self-pay | Admitting: Neurosurgery

## 2019-08-13 DIAGNOSIS — S065X9A Traumatic subdural hemorrhage with loss of consciousness of unspecified duration, initial encounter: Secondary | ICD-10-CM

## 2019-08-13 DIAGNOSIS — S065XAA Traumatic subdural hemorrhage with loss of consciousness status unknown, initial encounter: Secondary | ICD-10-CM

## 2019-08-26 ENCOUNTER — Ambulatory Visit
Admission: RE | Admit: 2019-08-26 | Discharge: 2019-08-26 | Disposition: A | Discharge status: Home / Self Care | Payer: 59 | Source: Ambulatory Visit | Attending: Neurosurgery | Admitting: Neurosurgery

## 2019-08-26 DIAGNOSIS — S065XAA Traumatic subdural hemorrhage with loss of consciousness status unknown, initial encounter: Secondary | ICD-10-CM

## 2019-08-26 DIAGNOSIS — S065X9A Traumatic subdural hemorrhage with loss of consciousness of unspecified duration, initial encounter: Secondary | ICD-10-CM

## 2019-08-27 ENCOUNTER — Other Ambulatory Visit: Payer: Self-pay | Admitting: Neurosurgery

## 2019-08-28 ENCOUNTER — Encounter (HOSPITAL_COMMUNITY): Payer: Self-pay | Admitting: *Deleted

## 2019-08-28 ENCOUNTER — Other Ambulatory Visit (HOSPITAL_COMMUNITY)
Admission: RE | Admit: 2019-08-28 | Discharge: 2019-08-28 | Disposition: A | Discharge status: Home / Self Care | Payer: 59 | Source: Ambulatory Visit | Attending: Neurosurgery | Admitting: Neurosurgery

## 2019-08-28 ENCOUNTER — Other Ambulatory Visit: Payer: Self-pay

## 2019-08-28 DIAGNOSIS — Z01812 Encounter for preprocedural laboratory examination: Secondary | ICD-10-CM | POA: Insufficient documentation

## 2019-08-28 DIAGNOSIS — Z20828 Contact with and (suspected) exposure to other viral communicable diseases: Secondary | ICD-10-CM | POA: Insufficient documentation

## 2019-08-28 LAB — SARS CORONAVIRUS 2 (TAT 6-24 HRS): SARS Coronavirus 2: NEGATIVE

## 2019-08-29 ENCOUNTER — Inpatient Hospital Stay (HOSPITAL_COMMUNITY): Payer: 59 | Admitting: Anesthesiology

## 2019-08-29 ENCOUNTER — Encounter (HOSPITAL_COMMUNITY): Payer: Self-pay

## 2019-08-29 ENCOUNTER — Encounter (HOSPITAL_COMMUNITY)
Admission: RE | Disposition: A | Discharge status: Home / Self Care | Payer: Self-pay | Source: Home / Self Care | Attending: Neurosurgery

## 2019-08-29 ENCOUNTER — Inpatient Hospital Stay (HOSPITAL_COMMUNITY)
Admission: RE | Admit: 2019-08-29 | Discharge: 2019-08-31 | DRG: 027 | Disposition: A | Discharge status: Home / Self Care | Payer: 59 | Attending: Neurosurgery | Admitting: Neurosurgery

## 2019-08-29 DIAGNOSIS — I6203 Nontraumatic chronic subdural hemorrhage: Principal | ICD-10-CM | POA: Diagnosis present

## 2019-08-29 DIAGNOSIS — Z8249 Family history of ischemic heart disease and other diseases of the circulatory system: Secondary | ICD-10-CM | POA: Diagnosis not present

## 2019-08-29 DIAGNOSIS — S065X9A Traumatic subdural hemorrhage with loss of consciousness of unspecified duration, initial encounter: Secondary | ICD-10-CM | POA: Diagnosis present

## 2019-08-29 DIAGNOSIS — Z79899 Other long term (current) drug therapy: Secondary | ICD-10-CM

## 2019-08-29 DIAGNOSIS — Z20828 Contact with and (suspected) exposure to other viral communicable diseases: Secondary | ICD-10-CM | POA: Diagnosis present

## 2019-08-29 DIAGNOSIS — S065XAA Traumatic subdural hemorrhage with loss of consciousness status unknown, initial encounter: Secondary | ICD-10-CM | POA: Diagnosis present

## 2019-08-29 HISTORY — PX: BURR HOLE: SHX908

## 2019-08-29 HISTORY — DX: Traumatic subdural hemorrhage with loss of consciousness status unknown, initial encounter: S06.5XAA

## 2019-08-29 HISTORY — DX: Traumatic subdural hemorrhage with loss of consciousness of unspecified duration, initial encounter: S06.5X9A

## 2019-08-29 HISTORY — DX: Other specified health status: Z78.9

## 2019-08-29 LAB — CBC WITH DIFFERENTIAL/PLATELET
Abs Immature Granulocytes: 0.03 10*3/uL (ref 0.00–0.07)
Basophils Absolute: 0 10*3/uL (ref 0.0–0.1)
Basophils Relative: 1 %
Eosinophils Absolute: 0.1 10*3/uL (ref 0.0–0.5)
Eosinophils Relative: 2 %
HCT: 38 % — ABNORMAL LOW (ref 39.0–52.0)
Hemoglobin: 12.3 g/dL — ABNORMAL LOW (ref 13.0–17.0)
Immature Granulocytes: 0 %
Lymphocytes Relative: 27 %
Lymphs Abs: 1.9 10*3/uL (ref 0.7–4.0)
MCH: 30.9 pg (ref 26.0–34.0)
MCHC: 32.4 g/dL (ref 30.0–36.0)
MCV: 95.5 fL (ref 80.0–100.0)
Monocytes Absolute: 0.6 10*3/uL (ref 0.1–1.0)
Monocytes Relative: 8 %
Neutro Abs: 4.5 10*3/uL (ref 1.7–7.7)
Neutrophils Relative %: 62 %
Platelets: 277 10*3/uL (ref 150–400)
RBC: 3.98 MIL/uL — ABNORMAL LOW (ref 4.22–5.81)
RDW: 11.9 % (ref 11.5–15.5)
WBC: 7.1 10*3/uL (ref 4.0–10.5)
nRBC: 0 % (ref 0.0–0.2)

## 2019-08-29 LAB — BASIC METABOLIC PANEL
Anion gap: 9 (ref 5–15)
BUN: 13 mg/dL (ref 6–20)
CO2: 23 mmol/L (ref 22–32)
Calcium: 9.2 mg/dL (ref 8.9–10.3)
Chloride: 108 mmol/L (ref 98–111)
Creatinine, Ser: 0.66 mg/dL (ref 0.61–1.24)
GFR calc Af Amer: >60 mL/min (ref >60)
GFR calc non Af Amer: >60 mL/min (ref >60)
Glucose, Bld: 91 mg/dL (ref 70–99)
Potassium: 3.7 mmol/L (ref 3.5–5.1)
Sodium: 140 mmol/L (ref 135–145)

## 2019-08-29 LAB — MRSA PCR SCREENING: MRSA by PCR: NEGATIVE

## 2019-08-29 SURGERY — CREATION, CRANIAL BURR HOLE
Anesthesia: General | Laterality: Right

## 2019-08-29 MED ORDER — SODIUM CHLORIDE 0.9 % IV SOLN
INTRAVENOUS | Status: DC
  Administered 2019-08-29: 13:00:00 via INTRAVENOUS

## 2019-08-29 MED ORDER — CEFAZOLIN SODIUM-DEXTROSE 2-4 GM/100ML-% IV SOLN
2.0000 g | INTRAVENOUS | Status: AC
  Administered 2019-08-29: 2 g via INTRAVENOUS

## 2019-08-29 MED ORDER — PHENYLEPHRINE HCL (PRESSORS) 10 MG/ML IV SOLN
INTRAVENOUS | Status: DC | PRN
  Administered 2019-08-29: 120 ug via INTRAVENOUS
  Administered 2019-08-29: 80 ug via INTRAVENOUS

## 2019-08-29 MED ORDER — LIDOCAINE 2% (20 MG/ML) 5 ML SYRINGE
INTRAMUSCULAR | Status: DC | PRN
  Administered 2019-08-29: 40 mg via INTRAVENOUS

## 2019-08-29 MED ORDER — ROCURONIUM BROMIDE 50 MG/5ML IV SOSY
PREFILLED_SYRINGE | INTRAVENOUS | Status: DC | PRN
  Administered 2019-08-29: 80 mg via INTRAVENOUS

## 2019-08-29 MED ORDER — DEXAMETHASONE SODIUM PHOSPHATE 10 MG/ML IJ SOLN
INTRAMUSCULAR | Status: AC
  Filled 2019-08-29: qty 1

## 2019-08-29 MED ORDER — SODIUM CHLORIDE 0.9 % IV SOLN
500.0000 mg | INTRAVENOUS | Status: AC
  Administered 2019-08-29: 15:00:00 500 mg via INTRAVENOUS
  Filled 2019-08-29: qty 5

## 2019-08-29 MED ORDER — CHLORHEXIDINE GLUCONATE CLOTH 2 % EX PADS: 6.0000 | MEDICATED_PAD | Freq: Once | CUTANEOUS | Status: DC

## 2019-08-29 MED ORDER — LACTATED RINGERS IV SOLN: INTRAVENOUS | Status: DC

## 2019-08-29 MED ORDER — BACITRACIN ZINC 500 UNIT/GM EX OINT
TOPICAL_OINTMENT | CUTANEOUS | Status: AC
  Filled 2019-08-29: qty 28.35

## 2019-08-29 MED ORDER — THROMBIN 20000 UNITS EX SOLR
CUTANEOUS | Status: AC
  Filled 2019-08-29: qty 20000

## 2019-08-29 MED ORDER — HYDROMORPHONE HCL 1 MG/ML IJ SOLN
0.5000 mg | INTRAMUSCULAR | Status: DC | PRN
  Administered 2019-08-29: 1 mg via INTRAVENOUS
  Filled 2019-08-29: qty 1

## 2019-08-29 MED ORDER — DEXAMETHASONE SODIUM PHOSPHATE 10 MG/ML IJ SOLN
10.0000 mg | Freq: Once | INTRAMUSCULAR | Status: AC
  Administered 2019-08-29: 10 mg via INTRAVENOUS

## 2019-08-29 MED ORDER — ONDANSETRON HCL 4 MG/2ML IJ SOLN
INTRAMUSCULAR | Status: DC | PRN
  Administered 2019-08-29: 4 mg via INTRAVENOUS

## 2019-08-29 MED ORDER — LIDOCAINE-EPINEPHRINE 1 %-1:100000 IJ SOLN
INTRAMUSCULAR | Status: DC | PRN
  Administered 2019-08-29: 10 mL

## 2019-08-29 MED ORDER — THROMBIN 20000 UNITS EX SOLR
OROMUCOSAL | Status: DC | PRN
  Administered 2019-08-29: 20 mL via TOPICAL

## 2019-08-29 MED ORDER — HYDROCODONE-ACETAMINOPHEN 5-325 MG PO TABS: 1.0000 | ORAL_TABLET | ORAL | Status: DC | PRN

## 2019-08-29 MED ORDER — PROPOFOL 10 MG/ML IV BOLUS
INTRAVENOUS | Status: DC | PRN
  Administered 2019-08-29: 200 mg via INTRAVENOUS

## 2019-08-29 MED ORDER — ACETAMINOPHEN 325 MG PO TABS
650.0000 mg | ORAL_TABLET | ORAL | Status: DC | PRN
  Administered 2019-08-29: 22:00:00 650 mg via ORAL
  Filled 2019-08-29: qty 2

## 2019-08-29 MED ORDER — CHLORHEXIDINE GLUCONATE CLOTH 2 % EX PADS
6.0000 | MEDICATED_PAD | Freq: Every day | CUTANEOUS | Status: DC
  Administered 2019-08-29: 6 via TOPICAL

## 2019-08-29 MED ORDER — 0.9 % SODIUM CHLORIDE (POUR BTL) OPTIME
TOPICAL | Status: DC | PRN
  Administered 2019-08-29 (×3): 1000 mL

## 2019-08-29 MED ORDER — CEFAZOLIN SODIUM-DEXTROSE 1-4 GM/50ML-% IV SOLN
1.0000 g | Freq: Three times a day (TID) | INTRAVENOUS | Status: AC
  Administered 2019-08-29 – 2019-08-30 (×2): 1 g via INTRAVENOUS
  Filled 2019-08-29 (×2): qty 50

## 2019-08-29 MED ORDER — FENTANYL CITRATE (PF) 250 MCG/5ML IJ SOLN
INTRAMUSCULAR | Status: AC
  Filled 2019-08-29: qty 5

## 2019-08-29 MED ORDER — ONDANSETRON HCL 4 MG PO TABS: 4.0000 mg | ORAL_TABLET | ORAL | Status: DC | PRN

## 2019-08-29 MED ORDER — LABETALOL HCL 5 MG/ML IV SOLN
10.0000 mg | INTRAVENOUS | Status: DC | PRN
  Filled 2019-08-29: qty 8

## 2019-08-29 MED ORDER — ONDANSETRON HCL 4 MG/2ML IJ SOLN: 4.0000 mg | INTRAMUSCULAR | Status: DC | PRN

## 2019-08-29 MED ORDER — PANTOPRAZOLE SODIUM 40 MG IV SOLR
40.0000 mg | Freq: Every day | INTRAVENOUS | Status: DC
  Administered 2019-08-29 – 2019-08-30 (×2): 40 mg via INTRAVENOUS
  Filled 2019-08-29 (×2): qty 40

## 2019-08-29 MED ORDER — PROMETHAZINE HCL 25 MG PO TABS: 12.5000 mg | ORAL_TABLET | ORAL | Status: DC | PRN

## 2019-08-29 MED ORDER — SODIUM CHLORIDE 0.9 % IV SOLN
INTRAVENOUS | Status: DC | PRN
  Administered 2019-08-29: 500 mL

## 2019-08-29 MED ORDER — FENTANYL CITRATE (PF) 100 MCG/2ML IJ SOLN
INTRAMUSCULAR | Status: DC | PRN
  Administered 2019-08-29: 100 ug via INTRAVENOUS
  Administered 2019-08-29: 50 ug via INTRAVENOUS

## 2019-08-29 MED ORDER — CEFAZOLIN SODIUM-DEXTROSE 2-4 GM/100ML-% IV SOLN
INTRAVENOUS | Status: AC
  Filled 2019-08-29: qty 100

## 2019-08-29 MED ORDER — VITAMIN D 25 MCG (1000 UNIT) PO TABS: 1000.0000 [IU] | ORAL_TABLET | ORAL | Status: DC

## 2019-08-29 MED ORDER — FENTANYL CITRATE (PF) 100 MCG/2ML IJ SOLN: 25.0000 ug | INTRAMUSCULAR | Status: DC | PRN

## 2019-08-29 MED ORDER — PROPOFOL 10 MG/ML IV BOLUS
INTRAVENOUS | Status: AC
  Filled 2019-08-29: qty 20

## 2019-08-29 MED ORDER — MICROFIBRILLAR COLL HEMOSTAT EX PADS
MEDICATED_PAD | CUTANEOUS | Status: DC | PRN
  Administered 2019-08-29: 1 via TOPICAL

## 2019-08-29 MED ORDER — EPHEDRINE SULFATE 50 MG/ML IJ SOLN
INTRAMUSCULAR | Status: DC | PRN
  Administered 2019-08-29: 5 mg via INTRAVENOUS

## 2019-08-29 MED ORDER — LIDOCAINE-EPINEPHRINE 1 %-1:100000 IJ SOLN
INTRAMUSCULAR | Status: AC
  Filled 2019-08-29: qty 1

## 2019-08-29 MED ORDER — ACETAMINOPHEN 650 MG RE SUPP: 650.0000 mg | RECTAL | Status: DC | PRN

## 2019-08-29 MED ORDER — LEVETIRACETAM IN NACL 500 MG/100ML IV SOLN
500.0000 mg | Freq: Two times a day (BID) | INTRAVENOUS | Status: DC
  Administered 2019-08-30 – 2019-08-31 (×3): 500 mg via INTRAVENOUS
  Filled 2019-08-29 (×4): qty 100

## 2019-08-29 MED ORDER — MIDAZOLAM HCL 5 MG/5ML IJ SOLN
INTRAMUSCULAR | Status: DC | PRN
  Administered 2019-08-29: 2 mg via INTRAVENOUS

## 2019-08-29 MED ORDER — MIDAZOLAM HCL 2 MG/2ML IJ SOLN
INTRAMUSCULAR | Status: AC
  Filled 2019-08-29: qty 2

## 2019-08-29 SURGICAL SUPPLY — 57 items
BAG DECANTER FOR FLEXI CONT (MISCELLANEOUS) ×2 IMPLANT
BLADE CLIPPER SURG (BLADE) IMPLANT
BLADE SURG 11 STRL SS (BLADE) ×2 IMPLANT
BNDG COHESIVE 4X5 TAN NS LF (GAUZE/BANDAGES/DRESSINGS) IMPLANT
BNDG STRETCH 4X75 STRL LF (GAUZE/BANDAGES/DRESSINGS) IMPLANT
BUR ACORN 6.0 (BURR) ×2 IMPLANT
CABLE BIPOLOR RESECTION CORD (MISCELLANEOUS) ×2 IMPLANT
CANISTER SUCT 3000ML PPV (MISCELLANEOUS) ×2 IMPLANT
CARTRIDGE OIL MAESTRO DRILL (MISCELLANEOUS) ×1 IMPLANT
CLIP VESOCCLUDE MED 6/CT (CLIP) IMPLANT
COVER WAND RF STERILE (DRAPES) ×2 IMPLANT
DECANTER SPIKE VIAL GLASS SM (MISCELLANEOUS) ×2 IMPLANT
DERMABOND ADVANCED (GAUZE/BANDAGES/DRESSINGS) ×1
DERMABOND ADVANCED .7 DNX12 (GAUZE/BANDAGES/DRESSINGS) ×1 IMPLANT
DIFFUSER DRILL AIR PNEUMATIC (MISCELLANEOUS) ×2 IMPLANT
DRAIN CHANNEL 10M FLAT 3/4 FLT (DRAIN) ×2 IMPLANT
DRAPE NEUROLOGICAL W/INCISE (DRAPES) IMPLANT
DRAPE WARM FLUID 44X44 (DRAPES) ×2 IMPLANT
ELECT CAUTERY BLADE 6.4 (BLADE) ×2 IMPLANT
ELECT REM PT RETURN 9FT ADLT (ELECTROSURGICAL) ×2
ELECTRODE REM PT RTRN 9FT ADLT (ELECTROSURGICAL) ×1 IMPLANT
EVACUATOR SILICONE 100CC (DRAIN) ×2 IMPLANT
GAUZE 4X4 16PLY RFD (DISPOSABLE) IMPLANT
GAUZE SPONGE 4X4 12PLY STRL (GAUZE/BANDAGES/DRESSINGS) ×2 IMPLANT
GAUZE SPONGE 4X4 12PLY STRL LF (GAUZE/BANDAGES/DRESSINGS) ×2 IMPLANT
GLOVE ECLIPSE 9.0 STRL (GLOVE) ×4 IMPLANT
GLOVE EXAM NITRILE XL STR (GLOVE) IMPLANT
GOWN STRL REUS W/ TWL LRG LVL3 (GOWN DISPOSABLE) ×1 IMPLANT
GOWN STRL REUS W/ TWL XL LVL3 (GOWN DISPOSABLE) ×1 IMPLANT
GOWN STRL REUS W/TWL 2XL LVL3 (GOWN DISPOSABLE) ×2 IMPLANT
GOWN STRL REUS W/TWL LRG LVL3 (GOWN DISPOSABLE) ×1
GOWN STRL REUS W/TWL XL LVL3 (GOWN DISPOSABLE) ×1
HEMOSTAT SURGICEL 2X14 (HEMOSTASIS) IMPLANT
HOOK DURA (MISCELLANEOUS) ×2 IMPLANT
KIT BASIN OR (CUSTOM PROCEDURE TRAY) ×2 IMPLANT
KIT TURNOVER KIT B (KITS) ×2 IMPLANT
NEEDLE HYPO 25X1 1.5 SAFETY (NEEDLE) ×2 IMPLANT
NS IRRIG 1000ML POUR BTL (IV SOLUTION) ×2 IMPLANT
OIL CARTRIDGE MAESTRO DRILL (MISCELLANEOUS) ×2
PACK CRANIOTOMY CUSTOM (CUSTOM PROCEDURE TRAY) ×2 IMPLANT
PAD ARMBOARD 7.5X6 YLW CONV (MISCELLANEOUS) ×6 IMPLANT
PATTIES SURGICAL .25X.25 (GAUZE/BANDAGES/DRESSINGS) IMPLANT
PATTIES SURGICAL .5 X.5 (GAUZE/BANDAGES/DRESSINGS) IMPLANT
PATTIES SURGICAL .5 X3 (DISPOSABLE) IMPLANT
PATTIES SURGICAL 1X1 (DISPOSABLE) IMPLANT
PIN MAYFIELD SKULL DISP (PIN) IMPLANT
SPONGE NEURO XRAY DETECT 1X3 (DISPOSABLE) IMPLANT
SPONGE SURGIFOAM ABS GEL 100 (HEMOSTASIS) IMPLANT
STAPLER VISISTAT 35W (STAPLE) ×2 IMPLANT
STOCKINETTE TUBULAR 6 INCH (GAUZE/BANDAGES/DRESSINGS) ×2 IMPLANT
SUT NURALON 4 0 TR CR/8 (SUTURE) ×4 IMPLANT
SUT VIC AB 2-0 CT1 18 (SUTURE) ×2 IMPLANT
SYR CONTROL 10ML LL (SYRINGE) ×2 IMPLANT
TOWEL GREEN STERILE (TOWEL DISPOSABLE) ×2 IMPLANT
TOWEL GREEN STERILE FF (TOWEL DISPOSABLE) ×2 IMPLANT
TRAY FOLEY MTR SLVR 16FR STAT (SET/KITS/TRAYS/PACK) IMPLANT
WATER STERILE IRR 1000ML POUR (IV SOLUTION) ×2 IMPLANT

## 2019-08-29 NOTE — Op Note (Signed)
Date of procedure: 08/29/2019  Date of dictation: Same  Service: Neurosurgery  Preoperative diagnosis: Right convexity chronic subdural hematoma  Postoperative diagnosis: Same  Procedure Name: Right frontal and right parietal bur hole evacuation of subdural hematoma, placement of subdural drain  Surgeon:Lejuan Botto A.Akaiya Touchette, M.D.  Asst. Surgeon: Reinaldo Meeker, NP  Anesthesia: General  Indication: 53 year old male status post left-sided bur hole evacuation of large chronic subdural hematoma, he presents now with enlarging right-sided chronic subdural hematoma.  Plan for bur hole evacuation of right-sided hematoma.  Operative note: After induction anesthesia, patient position supine with head turned toward the left.  Patient's right scalp prepped and draped sterilely.  Linear skin incisions were made in his right frontal and right parietal region.  Retractors placed.  Bur holes made using high-speed drill.  Dura opened in a cruciate fashion.  Dural leaflets were burned back to the edges of the bur hole.  Subdural fluid which was chronic in nature was drained from the subdural space.  There was an investing membrane overlying the cortical surface.  This was opened both in the parietal and frontal region exposing the underlying brain and evacuating further old hemorrhage.  A 10 mm Blake drain was then passed from the parietal hole past the frontal hole under direct visualization.  The drain was exited through a separate stab incision.  It was secured in place.  The wounds were irrigated.  Hemostasis was good.  Wound was then closed in typical fashion.  Staples were applied to the surface.  Patient tolerated procedure well and he returned to the recovery room postop.

## 2019-08-29 NOTE — Transfer of Care (Signed)
Immediate Anesthesia Transfer of Care Note  Patient: John Michael  Procedure(s) Performed: Right bur hole evac of SDH (Right )  Patient Location: PACU  Anesthesia Type:General  Level of Consciousness: awake, alert , oriented and patient cooperative  Airway & Oxygen Therapy: Patient Spontanous Breathing and Patient connected to nasal cannula oxygen  Post-op Assessment: Report given to RN and Post -op Vital signs reviewed and stable  Post vital signs: Reviewed and stable  Last Vitals:  Vitals Value Taken Time  BP    Temp    Pulse    Resp    SpO2      Last Pain:  Vitals:   08/29/19 1157  TempSrc:   PainSc: 0-No pain         Complications: No apparent anesthesia complications

## 2019-08-29 NOTE — Plan of Care (Signed)
VSS

## 2019-08-29 NOTE — H&P (Signed)
John Michael is an 53 y.o. male.   Chief Complaint: Subdural hematoma HPI: 53 year old male with history of recently discovered bilateral chronic subdural hematomas.  Patient status post bur hole evacuation on the left side with good result.  A small right-sided subdural hematoma has enlarged over time and now is beginning to exert significant mass-effect upon the right hemisphere.  Patient with minimal headache.  He does complain of some generalized tiredness.  He has no symptoms of numbness paresthesias or weakness.  Patient presents now for bur hole evacuation.  Past Medical History:  Diagnosis Date  . Medical history non-contributory   . SDH (subdural hematoma) (HCC)     Past Surgical History:  Procedure Laterality Date  . BURR HOLE Left 07/29/2019   Procedure: BURR HOLE FOR EVACUATION OF SUBDURAL HEMATOMA;  Surgeon: Earnie Larsson, MD;  Location: [facility];  Service: Neurosurgery;  Laterality: Left;  . COLONOSCOPY      Family History  Problem Relation Age of Onset  . Hypertension Mother   . Hypertension Father    Social History:  reports that he has never smoked. He has never used smokeless tobacco. He reports previous alcohol use. He reports that he does not use drugs.  Allergies: No Known Allergies  Medications Prior to Admission  Medication Sig Dispense Refill  . acetaminophen (TYLENOL) 500 MG tablet Take 500 mg by mouth every 6 (six) hours as needed for moderate pain or headache.    . cholecalciferol (VITAMIN D3) 25 MCG (1000 UT) tablet Take 1,000 Units by mouth 2 (two) times a week.    . Multiple Vitamin (MULTIVITAMIN WITH MINERALS) TABS tablet Take 1 tablet by mouth daily.      Results for orders placed or performed during the hospital encounter of 08/29/19 (from the past 48 hour(s))  CBC WITH DIFFERENTIAL     Status: Abnormal   Collection Time: 08/29/19 11:36 AM  Result Value Ref Range   WBC 7.1 4.0 - 10.5 K/uL   RBC 3.98 (L) 4.22 - 5.81 MIL/uL   Hemoglobin 12.3 (L) 13.0  - 17.0 g/dL   HCT 38.0 (L) 39.0 - 52.0 %   MCV 95.5 80.0 - 100.0 fL   MCH 30.9 26.0 - 34.0 pg   MCHC 32.4 30.0 - 36.0 g/dL   RDW 11.9 11.5 - 15.5 %   Platelets 277 150 - 400 K/uL   nRBC 0.0 0.0 - 0.2 %   Neutrophils Relative % 62 %   Neutro Abs 4.5 1.7 - 7.7 K/uL   Lymphocytes Relative 27 %   Lymphs Abs 1.9 0.7 - 4.0 K/uL   Monocytes Relative 8 %   Monocytes Absolute 0.6 0.1 - 1.0 K/uL   Eosinophils Relative 2 %   Eosinophils Absolute 0.1 0.0 - 0.5 K/uL   Basophils Relative 1 %   Basophils Absolute 0.0 0.0 - 0.1 K/uL   Immature Granulocytes 0 %   Abs Immature Granulocytes 0.03 0.00 - 0.07 K/uL    Comment: Performed at [facility], [address], [place], [place] [address]   No results found.  Pertinent items noted in HPI and remainder of comprehensive ROS otherwise negative.  Blood pressure 112/77, pulse 62, temperature 98.1 F (36.7 C), temperature source Oral, resp. rate 20, height 5\' 6"  (1.676 m), weight 63.5 kg, SpO2 100 %.  Patient is awake and alert.  He is oriented and appropriate.  Speech is fluent.  Judgment and insight are intact.  Cranial nerve function normal bilateral.  Motor examination  normal bilaterally.  Sensory examination nonfocal.  Deep tender versus normal active.  Previous left frontal and left parietal wounds well-healed.  Examination head ears eyes nose throat is unremarked.  Chest and abdomen are benign.  Extremities are free from injury or deformity. Assessment/Plan Enlarging right convexity chronic subdural hematoma with increasing mass-effect in size.  Plan right-sided bur hole evacuation of subdural hematoma.  Risks and benefits of been explained.  Patient wishes to proceed.  Sherilyn CooterHenry A Yuniel Blaney 08/29/2019, 12:51 PM

## 2019-08-29 NOTE — Anesthesia Preprocedure Evaluation (Addendum)
Anesthesia Evaluation  Patient identified by MRN, date of birth, ID band Patient unresponsive    Reviewed: Allergy & Precautions, NPO status , Patient's Chart, lab work & pertinent test results  Airway Mallampati: II  TM Distance: >3 FB Neck ROM: Full    Dental  (+) Partial Upper, Dental Advisory Given,    Pulmonary neg pulmonary ROS,    breath sounds clear to auscultation       Cardiovascular negative cardio ROS   Rhythm:Regular Rate:Normal     Neuro/Psych negative neurological ROS     GI/Hepatic negative GI ROS, Neg liver ROS,   Endo/Other  negative endocrine ROS  Renal/GU negative Renal ROS     Musculoskeletal   Abdominal Normal abdominal exam  (+)   Peds  Hematology negative hematology ROS (+)   Anesthesia Other Findings   Reproductive/Obstetrics                            Anesthesia Physical  Anesthesia Plan  ASA: III  Anesthesia Plan: General   Post-op Pain Management:    Induction: Intravenous  PONV Risk Score and Plan: 3 and Ondansetron, Dexamethasone and Treatment may vary due to age or medical condition  Airway Management Planned: Oral ETT  Additional Equipment:   Intra-op Plan:   Post-operative Plan: Possible Post-op intubation/ventilation  Informed Consent: I have reviewed the patients History and Physical, chart, labs and discussed the procedure including the risks, benefits and alternatives for the proposed anesthesia with the patient or authorized representative who has indicated his/her understanding and acceptance.     Dental advisory given and Consent reviewed with POA  Plan Discussed with: CRNA, Anesthesiologist and Surgeon  Anesthesia Plan Comments:         Anesthesia Quick Evaluation

## 2019-08-29 NOTE — Brief Op Note (Signed)
08/29/2019  2:13 PM  PATIENT:  John Michael  53 y.o. male  PRE-OPERATIVE DIAGNOSIS:  Subdural Hematoma  POST-OPERATIVE DIAGNOSIS:  subdural hematoma  PROCEDURE:  Procedure(s): Right bur hole evac of SDH (Right)  SURGEON:  Surgeon(s) and Role:    * Earnie Larsson, MD - Primary  PHYSICIAN ASSISTANT:   ASSISTANTSReinaldo Meeker, NP   ANESTHESIA:   general  EBL:  100 mL   BLOOD ADMINISTERED:none  DRAINS: (10 mm ) Blake drain(s) in the subdural space   LOCAL MEDICATIONS USED:  LIDOCAINE   SPECIMEN:  No Specimen  DISPOSITION OF SPECIMEN:  N/A  COUNTS:  YES  TOURNIQUET:  * No tourniquets in log *  DICTATION: .Dragon Dictation  PLAN OF CARE: Admit to inpatient   PATIENT DISPOSITION:  PACU - hemodynamically stable.   Delay start of Pharmacological VTE agent (>24hrs) due to surgical blood loss or risk of bleeding: yes

## 2019-08-30 ENCOUNTER — Inpatient Hospital Stay (HOSPITAL_COMMUNITY): Payer: 59

## 2019-08-30 NOTE — Progress Notes (Signed)
Neurosurgery Service Progress Note  Subjective: No acute events overnight, no new complaints, sitting up in bed eating breakfast this morning  Objective: Vitals:   08/30/19 0500 08/30/19 0523 08/30/19 0600 08/30/19 0700  BP: (!) 65/40 93/66 94/64  98/68  Pulse: (!) 54 64 (!) 54 (!) 54  Resp:   10 10  Temp:      TempSrc:      SpO2: 98% 99% 98% 98%  Weight:      Height:       Temp (24hrs), Avg:97.9 F (36.6 C), Min:96.8 F (36 C), Max:98.7 F (37.1 C)  CBC Latest Ref Rng & Units 08/29/2019 07/30/2019 07/29/2019  WBC 4.0 - 10.5 K/uL 7.1 9.3 7.6  Hemoglobin 13.0 - 17.0 g/dL 12.3(L) 12.0(L) 13.2  Hematocrit 39.0 - 52.0 % 38.0(L) 35.6(L) 40.0  Platelets 150 - 400 K/uL 277 223 249   BMP Latest Ref Rng & Units 08/29/2019 07/30/2019 07/29/2019  Glucose 70 - 99 mg/dL 91 83 106(H)  BUN 6 - 20 mg/dL 13 13 23(H)  Creatinine 0.61 - 1.24 mg/dL 0.66 0.78 0.90  Sodium 135 - 145 mmol/L 140 139 141  Potassium 3.5 - 5.1 mmol/L 3.7 3.8 4.2  Chloride 98 - 111 mmol/L 108 109 107  CO2 22 - 32 mmol/L 23 23 25   Calcium 8.9 - 10.3 mg/dL 9.2 8.4(L) 9.1    Intake/Output Summary (Last 24 hours) at 08/30/2019 0834 Last data filed at 08/30/2019 0500 Gross per 24 hour  Intake 1004.58 ml  Output 180 ml  Net 824.58 ml    Current Facility-Administered Medications:  .  acetaminophen (TYLENOL) tablet 650 mg, 650 mg, Oral, Q4H PRN, 650 mg at 08/29/19 2141 **OR** acetaminophen (TYLENOL) suppository 650 mg, 650 mg, Rectal, Q4H PRN, Earnie Larsson, MD .  Chlorhexidine Gluconate Cloth 2 % PADS 6 each, 6 each, Topical, Daily, Earnie Larsson, MD, 6 each at 08/29/19 1715 .  [START ON 09/01/2019] cholecalciferol (VITAMIN D3) tablet 1,000 Units, 1,000 Units, Oral, 2 times weekly, Pool, Mallie Mussel, MD .  HYDROcodone-acetaminophen (NORCO/VICODIN) 5-325 MG per tablet 1 tablet, 1 tablet, Oral, Q4H PRN, Earnie Larsson, MD .  HYDROmorphone (DILAUDID) injection 0.5-1 mg, 0.5-1 mg, Intravenous, Q2H PRN, Earnie Larsson, MD, 1 mg at 08/29/19  2346 .  labetalol (NORMODYNE) injection 10-40 mg, 10-40 mg, Intravenous, Q10 min PRN, Earnie Larsson, MD .  levETIRAcetam (KEPPRA) IVPB 500 mg/100 mL premix, 500 mg, Intravenous, Q12H, Earnie Larsson, MD, Stopped at 08/30/19 0357 .  ondansetron (ZOFRAN) tablet 4 mg, 4 mg, Oral, Q4H PRN **OR** ondansetron (ZOFRAN) injection 4 mg, 4 mg, Intravenous, Q4H PRN, Pool, Mallie Mussel, MD .  pantoprazole (PROTONIX) injection 40 mg, 40 mg, Intravenous, QHS, Pool, Mallie Mussel, MD, 40 mg at 08/29/19 2141 .  promethazine (PHENERGAN) tablet 12.5-25 mg, 12.5-25 mg, Oral, Q4H PRN, Earnie Larsson, MD   Physical Exam: AOx3, PERRL, EOMI, FS, Strength 5/5 x4, SILTx4 Incision c/d/i, drain w/ serosang output  Assessment & Plan: 53 y.o. man s/p burr hole evac of R SDH, recovering well.  -post-op CTH shows good evacuation, catheter in position -continue drain, likely d/c drain tomorrow  Judith Part  08/30/19 8:34 AM

## 2019-08-30 NOTE — Plan of Care (Signed)
  Problem: Nutrition: Goal: Adequate nutrition will be maintained Outcome: Progressing   

## 2019-08-30 NOTE — Plan of Care (Signed)
  Problem: Activity: Goal: Risk for activity intolerance will decrease Outcome: Progressing   

## 2019-08-31 MED ORDER — DOCUSATE SODIUM 100 MG PO CAPS: 100.0000 mg | ORAL_CAPSULE | Freq: Two times a day (BID) | ORAL | 0 refills | Status: AC

## 2019-08-31 MED ORDER — HYDROCODONE-ACETAMINOPHEN 5-325 MG PO TABS: 1.0000 | ORAL_TABLET | ORAL | 0 refills | Status: AC | PRN

## 2019-08-31 NOTE — Progress Notes (Signed)
Pt transferred to 5C-12, report given to Val Eagle, Therapist, sports. Discharge instructions given to patient and wife.

## 2019-08-31 NOTE — Discharge Instructions (Signed)
Discharge Instructions  No restriction in activities, slowly increase your activity back to normal.   No dressing needed. If your incision begins to itch, apply some antibiotic ointment like bacitracin or neosporin on the staples.   Okay to shower on the day of discharge. Be gentle when cleaning your incision. Use regular soap and water. If that is uncomfortable, try using baby shampoo. Do not submerge the wound under water for 2 weeks after surgery.  Follow up with Dr. Annette Stable in 2 weeks after discharge. If you do not already have a discharge appointment, please call his office at [phone] to schedule a follow up appointment. If you have any concerns or questions, please call the office and let us know.

## 2019-08-31 NOTE — Progress Notes (Signed)
Patient currently on the DC unit waiting for transportation from his spouse. He appears comfortable, denies pain at this time.   Ave Filter, RN

## 2019-08-31 NOTE — Discharge Summary (Signed)
Discharge Summary  Date of Admission: 08/29/2019  Date of Discharge: 08/31/19  Attending Physician: Earnie Larsson, MD  Hospital Course: Patient was admitted following an uncomplicated burr hole evacuation of subdural hematoma. He was recovered in PACU and transferred to 4N. A post-op CTH showed good evacuation of the SDH. His hospital course was uncomplicated and the patient was discharged home on 08/31/2019. He will follow up in clinic with Dr. Annette Stable in 2 weeks.  Neurologic exam at discharge:  AOx3, PERRL, EOMI, FS, TM Strength 5/5 x4, SILTx4, no drift  Discharge diagnosis: Subdural hematoma  Judith Part, MD 08/31/19 10:23 AM

## 2019-08-31 NOTE — Progress Notes (Signed)
Neurosurgery Service Progress Note  Subjective: No acute events overnight, no new complaints  Objective: Vitals:   08/31/19 0500 08/31/19 0600 08/31/19 0700 08/31/19 0800  BP: 101/73 97/64 95/81  96/63  Pulse: (!) 59 (!) 57 60 70  Resp: 15 14 15 14   Temp:      TempSrc:      SpO2: 97% 98% 97% 98%  Weight:      Height:       Temp (24hrs), Avg:98.4 F (36.9 C), Min:98 F (36.7 C), Max:98.8 F (37.1 C)  CBC Latest Ref Rng & Units 08/29/2019 07/30/2019 07/29/2019  WBC 4.0 - 10.5 K/uL 7.1 9.3 7.6  Hemoglobin 13.0 - 17.0 g/dL 12.3(L) 12.0(L) 13.2  Hematocrit 39.0 - 52.0 % 38.0(L) 35.6(L) 40.0  Platelets 150 - 400 K/uL 277 223 249   BMP Latest Ref Rng & Units 08/29/2019 07/30/2019 07/29/2019  Glucose 70 - 99 mg/dL 91 83 106(H)  BUN 6 - 20 mg/dL 13 13 23(H)  Creatinine 0.61 - 1.24 mg/dL 0.66 0.78 0.90  Sodium 135 - 145 mmol/L 140 139 141  Potassium 3.5 - 5.1 mmol/L 3.7 3.8 4.2  Chloride 98 - 111 mmol/L 108 109 107  CO2 22 - 32 mmol/L 23 23 25   Calcium 8.9 - 10.3 mg/dL 9.2 8.4(L) 9.1    Intake/Output Summary (Last 24 hours) at 08/31/2019 0851 Last data filed at 08/31/2019 0645 Gross per 24 hour  Intake -  Output 35 ml  Net -35 ml    Current Facility-Administered Medications:  .  acetaminophen (TYLENOL) tablet 650 mg, 650 mg, Oral, Q4H PRN, 650 mg at 08/29/19 2141 **OR** acetaminophen (TYLENOL) suppository 650 mg, 650 mg, Rectal, Q4H PRN, Earnie Larsson, MD .  Chlorhexidine Gluconate Cloth 2 % PADS 6 each, 6 each, Topical, Daily, Earnie Larsson, MD, 6 each at 08/29/19 1715 .  [START ON 09/01/2019] cholecalciferol (VITAMIN D3) tablet 1,000 Units, 1,000 Units, Oral, 2 times weekly, Pool, Mallie Mussel, MD .  HYDROcodone-acetaminophen (NORCO/VICODIN) 5-325 MG per tablet 1 tablet, 1 tablet, Oral, Q4H PRN, Earnie Larsson, MD .  HYDROmorphone (DILAUDID) injection 0.5-1 mg, 0.5-1 mg, Intravenous, Q2H PRN, Earnie Larsson, MD, 1 mg at 08/29/19 2346 .  labetalol (NORMODYNE) injection 10-40 mg, 10-40 mg,  Intravenous, Q10 min PRN, Earnie Larsson, MD .  levETIRAcetam (KEPPRA) IVPB 500 mg/100 mL premix, 500 mg, Intravenous, Q12H, Pool, Mallie Mussel, MD, Last Rate: 400 mL/hr at 08/31/19 0222, 500 mg at 08/31/19 0222 .  ondansetron (ZOFRAN) tablet 4 mg, 4 mg, Oral, Q4H PRN **OR** ondansetron (ZOFRAN) injection 4 mg, 4 mg, Intravenous, Q4H PRN, Pool, Mallie Mussel, MD .  pantoprazole (PROTONIX) injection 40 mg, 40 mg, Intravenous, QHS, Pool, Mallie Mussel, MD, 40 mg at 08/30/19 2145 .  promethazine (PHENERGAN) tablet 12.5-25 mg, 12.5-25 mg, Oral, Q4H PRN, Earnie Larsson, MD   Physical Exam: AOx3, PERRL, EOMI, FS, Strength 5/5 x4, SILTx4 Incision c/d/i, drain w/ serosang output  Assessment & Plan: 53 y.o. man s/p burr hole evac of R SDH, recovering well. 9/19 CTH shows good evacuation, catheter in position  -d/c drain -SCDs/TEDs -discharge home today  Judith Part  08/31/19 8:51 AM

## 2019-09-01 NOTE — Anesthesia Postprocedure Evaluation (Signed)
Anesthesia Post Note  Patient: John Michael  Procedure(s) Performed: Right bur hole evac of SDH (Right )     Patient location during evaluation: PACU Anesthesia Type: General Level of consciousness: awake and alert Pain management: pain level controlled Vital Signs Assessment: post-procedure vital signs reviewed and stable Respiratory status: spontaneous breathing, nonlabored ventilation, respiratory function stable and patient connected to nasal cannula oxygen Cardiovascular status: blood pressure returned to baseline and stable Postop Assessment: no apparent nausea or vomiting Anesthetic complications: no    Last Vitals:  Vitals:   08/31/19 1200 08/31/19 1217  BP: 109/82 124/74  Pulse: 70 65  Resp: 13 16  Temp:  37.1 C  SpO2: 100% 98%    Last Pain:  Vitals:   08/31/19 1252  TempSrc:   PainSc: 0-No pain                 Zakai Gonyea L Shayda Kalka

## 2019-09-02 ENCOUNTER — Encounter (HOSPITAL_COMMUNITY): Payer: Self-pay | Admitting: Neurosurgery

## 2019-09-17 ENCOUNTER — Other Ambulatory Visit: Payer: Self-pay | Admitting: Neurosurgery

## 2019-09-17 DIAGNOSIS — S065XAA Traumatic subdural hemorrhage with loss of consciousness status unknown, initial encounter: Secondary | ICD-10-CM

## 2019-09-17 DIAGNOSIS — S065X9A Traumatic subdural hemorrhage with loss of consciousness of unspecified duration, initial encounter: Secondary | ICD-10-CM

## 2019-10-08 ENCOUNTER — Ambulatory Visit
Admission: RE | Admit: 2019-10-08 | Discharge: 2019-10-08 | Disposition: A | Discharge status: Home / Self Care | Payer: 59 | Source: Ambulatory Visit | Attending: Neurosurgery | Admitting: Neurosurgery

## 2019-10-08 DIAGNOSIS — S065X9A Traumatic subdural hemorrhage with loss of consciousness of unspecified duration, initial encounter: Secondary | ICD-10-CM

## 2019-10-08 DIAGNOSIS — S065XAA Traumatic subdural hemorrhage with loss of consciousness status unknown, initial encounter: Secondary | ICD-10-CM

## 2021-06-15 IMAGING — DX PORTABLE CHEST - 1 VIEW
1 series · 1 of 1 positions shown · non-contrast
Comparison: None.

CLINICAL DATA: Altered mental status

EXAM:
PORTABLE CHEST 1 VIEW

[chest ap]
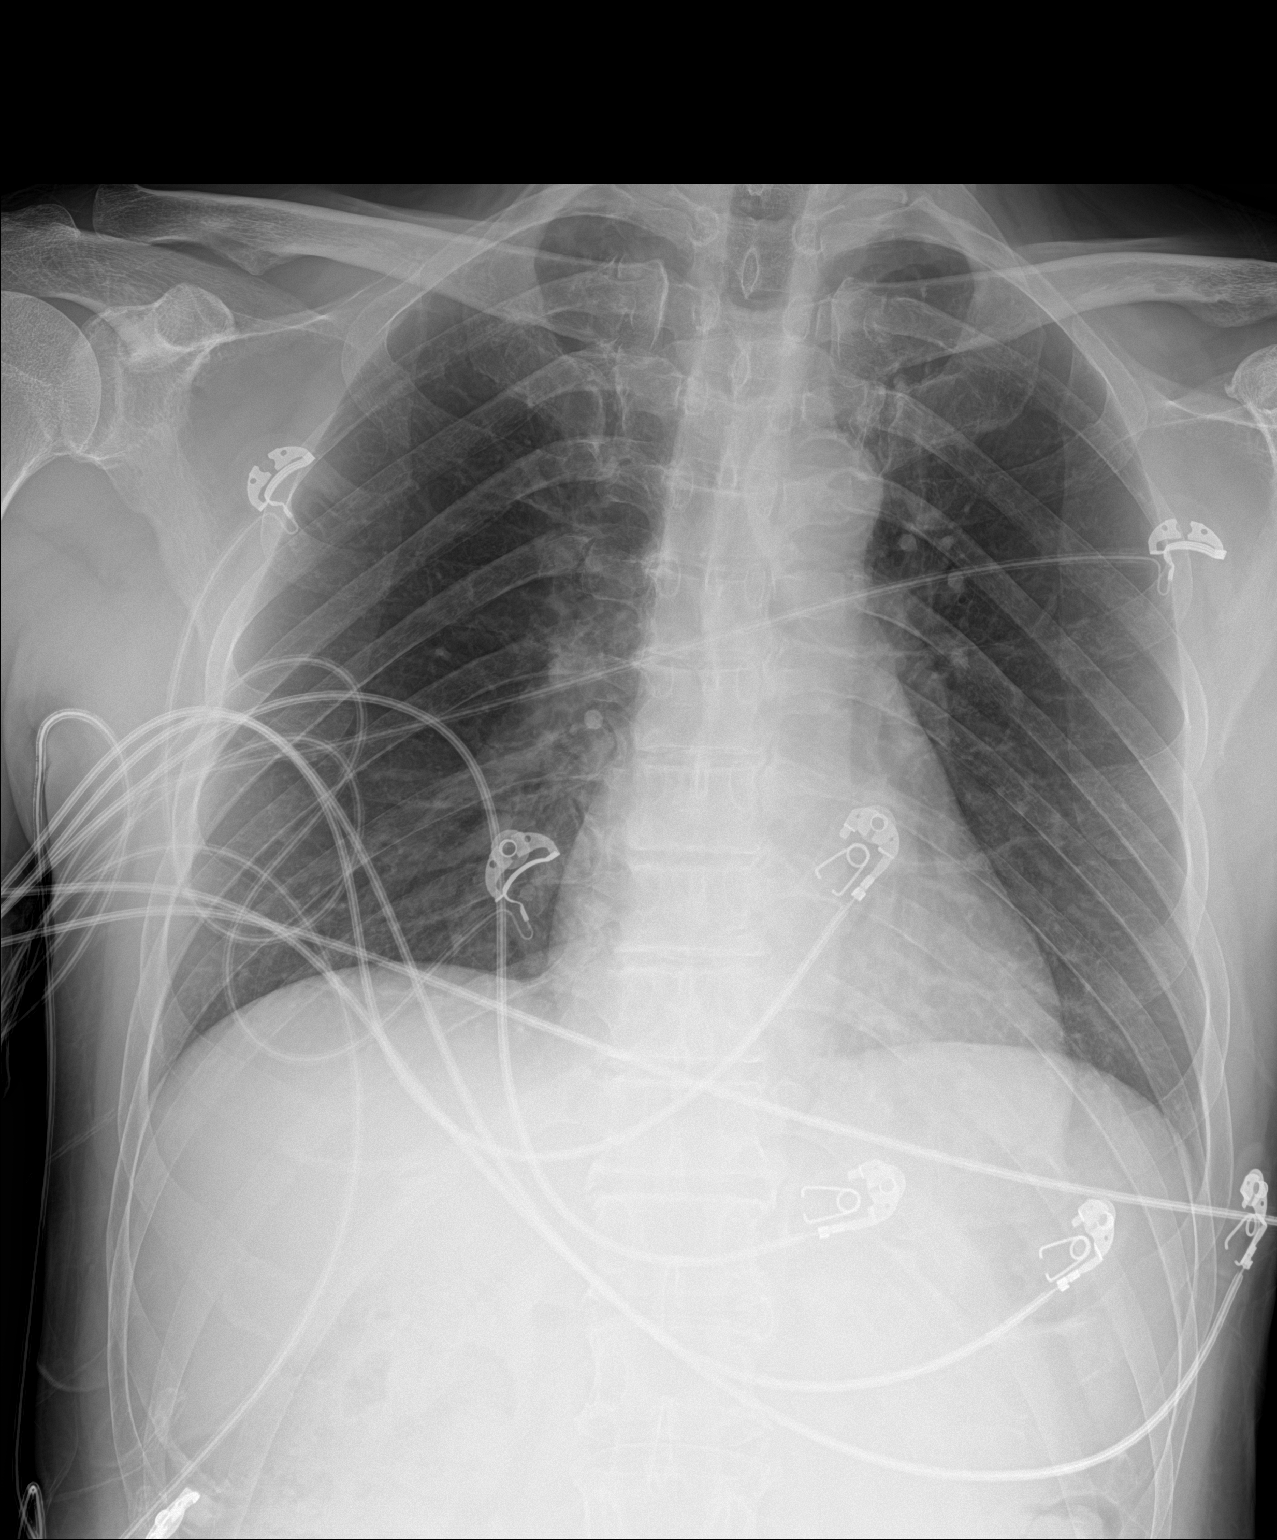

[1 of 1 positions shown; findings below may reference images not displayed]

FINDINGS: Lungs are clear. Heart size and pulmonary vascularity are normal. No
adenopathy. No pneumothorax. No bone lesions.
IMPRESSION: No edema or consolidation.

## 2021-06-16 IMAGING — CT CT HEAD WITHOUT CONTRAST
4 series · 15 of 47 positions shown, 17 images · non-contrast
Comparison: CT head without contrast 07/29/2019

CLINICAL DATA: Subdural hematoma.

EXAM:
CT HEAD WITHOUT CONTRAST
TECHNIQUE: Contiguous axial images were obtained from the base of the skull
through the vertex without intravenous contrast.

[Series 3: head without · axial · non-contrast · 0.45mm/px · z∈[-232,-107]mm · 7 of 35 slices shown, 9 images]
[im 5/35  brain]
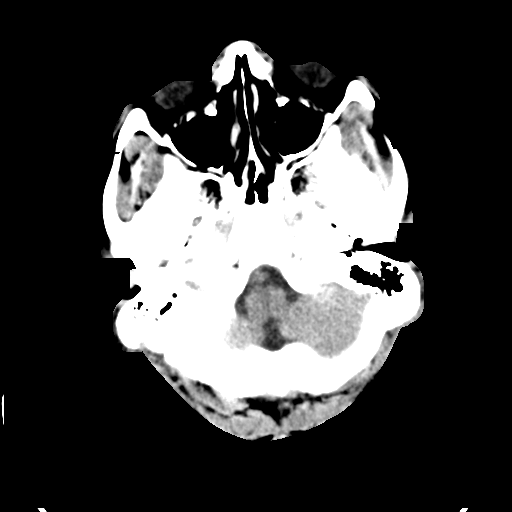
[im 5/35  bone]
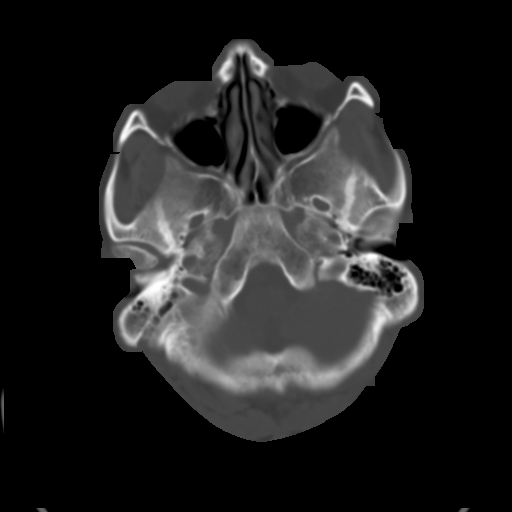
[im 9/35  brain]
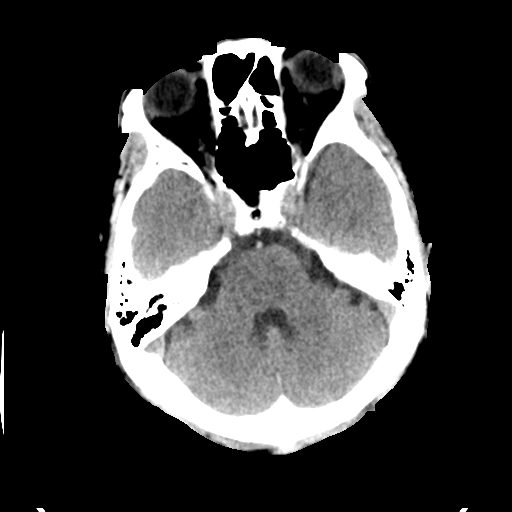
[im 13/35  brain]
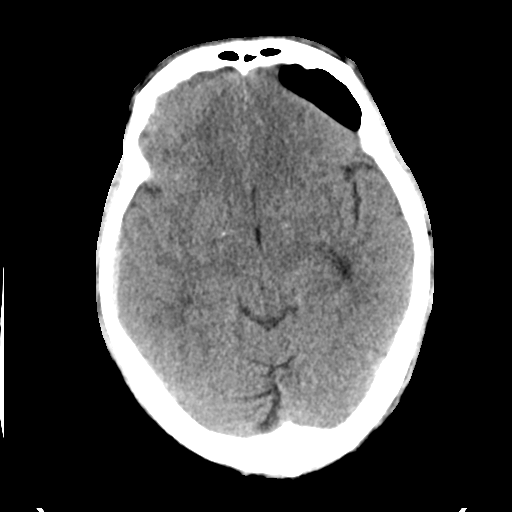
[im 18/35  brain]
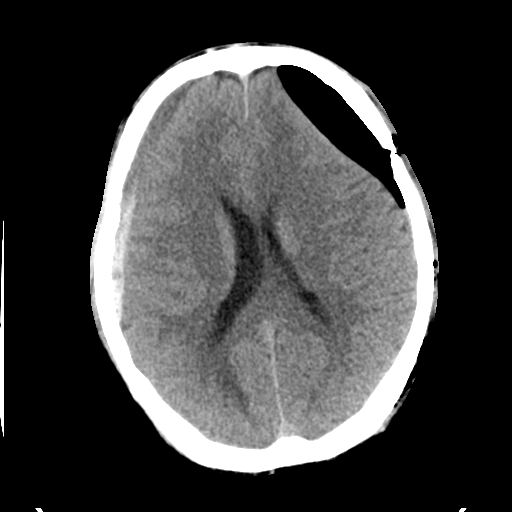
[im 22/35  brain]
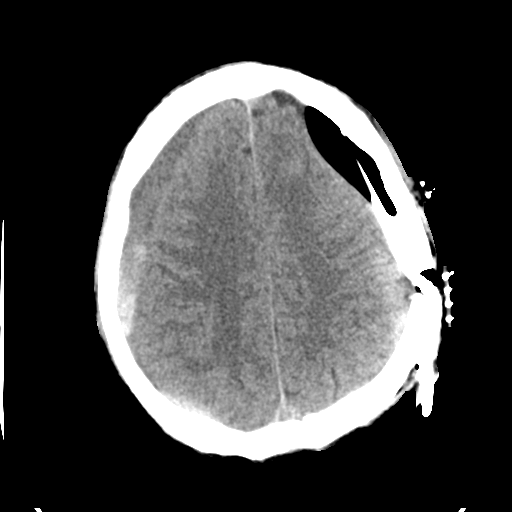
[im 22/35  bone]
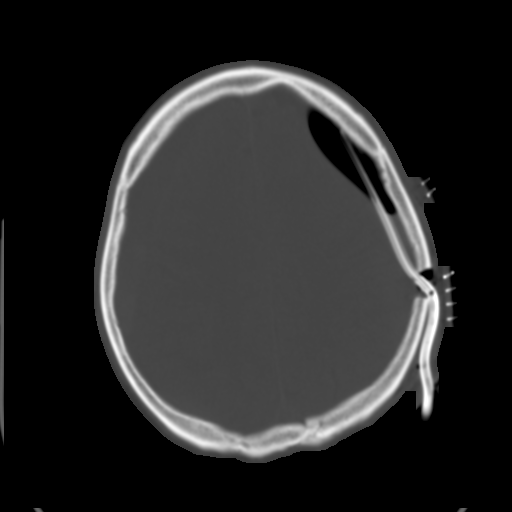
[im 26/35  brain]
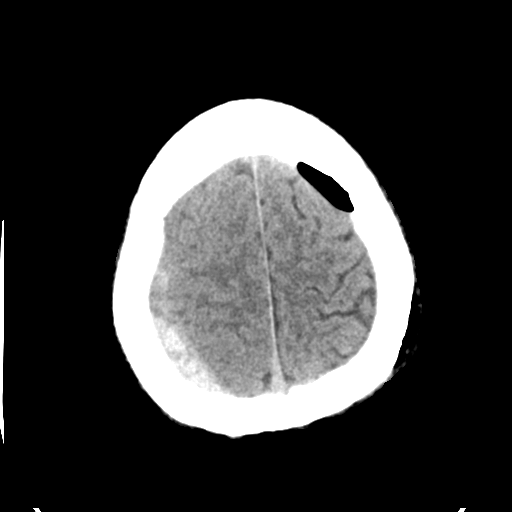
[im 30/35  brain]
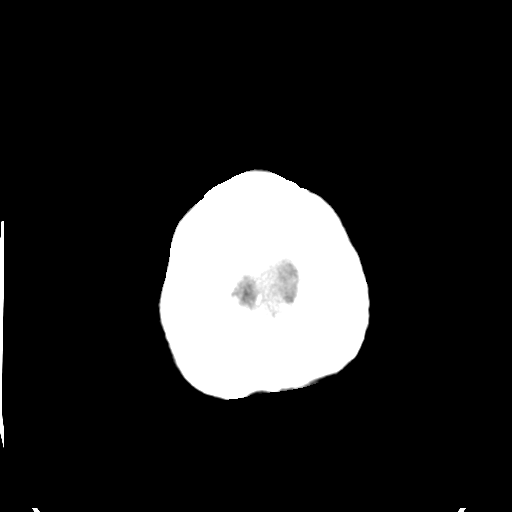

[Series 4: head bone · axial · 0.45mm/px · z∈[-236,-218]mm · 2 of 88 slices shown]
[im 9/88  bone]
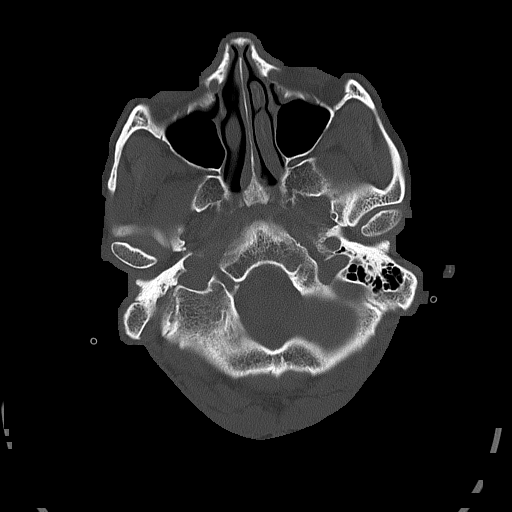
[im 18/88  bone]
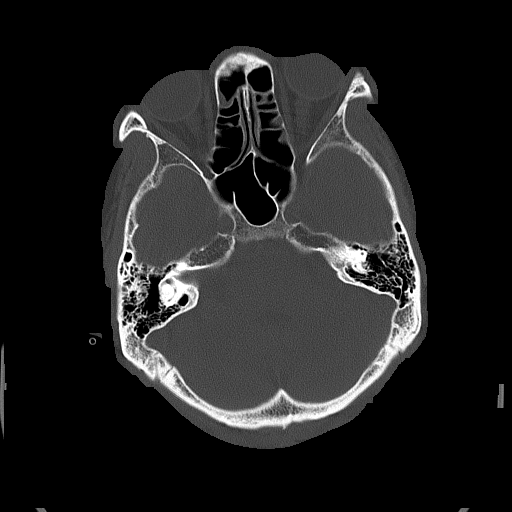

[Series 5: head without cor · coronal · non-contrast · 0.35mm/px · 3 of 74 slices shown]
[im 25/74  brain]
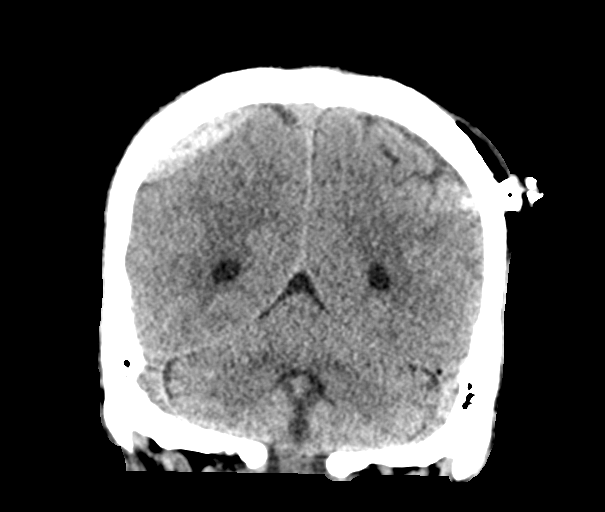
[im 33/74  brain]
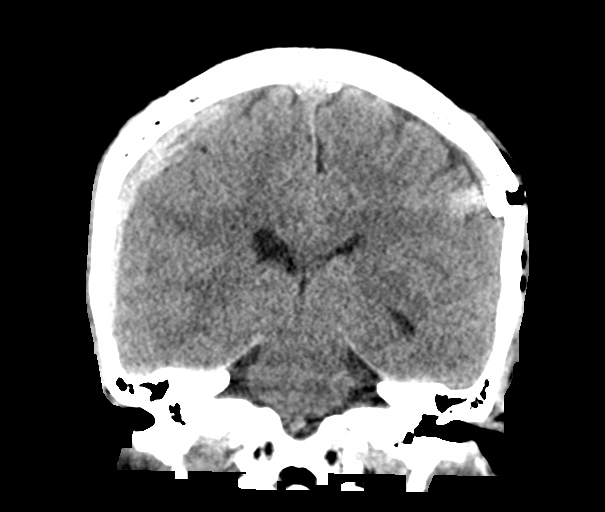
[im 41/74  brain]
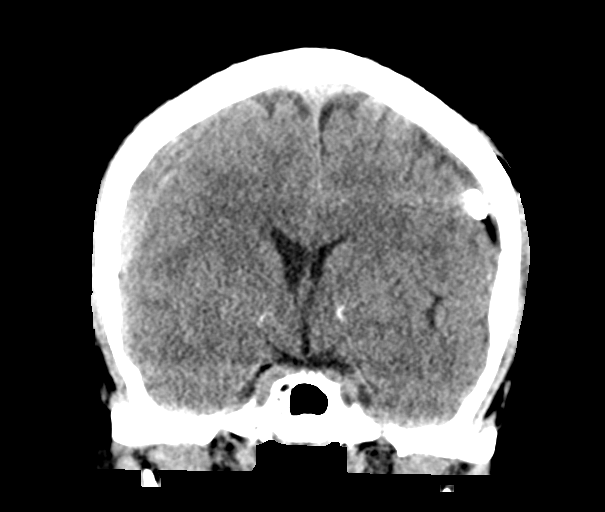

[Series 6: head without sag · sagittal · non-contrast · 0.34mm/px · 3 of 65 slices shown]
[im 22/65  brain]
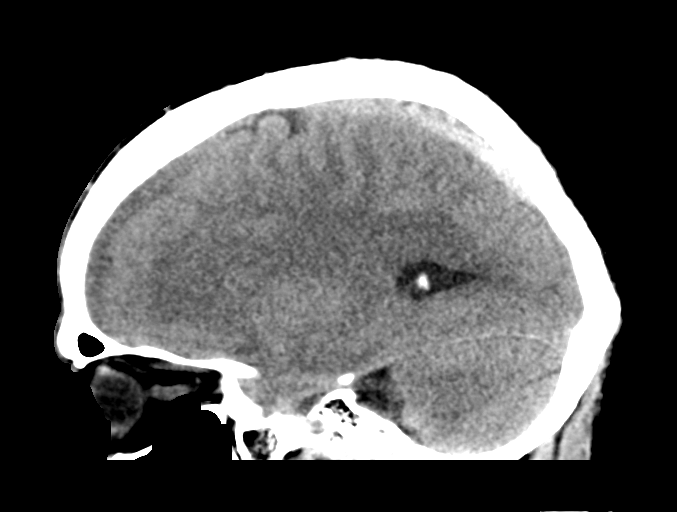
[im 33/65  brain]
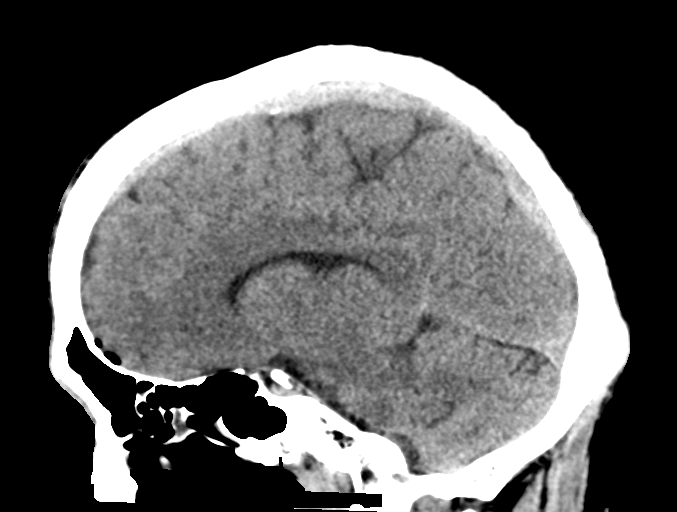
[im 43/65  brain]
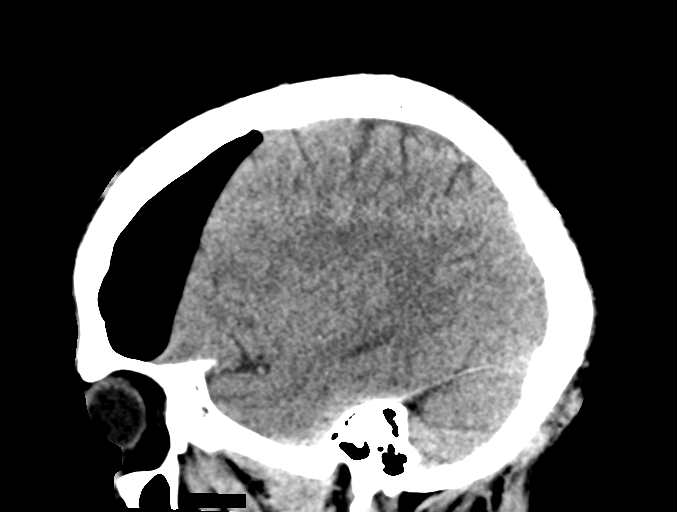

[15 of 47 positions shown; findings below may reference images not displayed]

FINDINGS: Brain: Patient is status post left frontal and burr holes for
evacuation extra-axial hemorrhage. Extra-axial drain is present on
left. The extra-axial collection is largely been evacuated. There is
left-sided pneumocephalus. The right-sided mixed density extra-axial
collection is stable measuring up to 8.5 mm on the coronal images.
Previously seen midline shift is markedly improved. Midline shift at
the foramen of [place] measures 4 mm. Basal ganglia are intact. No
acute or focal cortical infarct is present. There is no parenchymal
hemorrhage. No intraventricular hemorrhage is present. The brainstem
and cerebellum are within limits.

Vascular: No hyperdense vessel or unexpected calcification.

Skull: Left frontal burr holes scratched at left frontal and
parietal burr holes are noted. Calvarium is otherwise intact.

Sinuses/Orbits: Mucosal thickening is present within ethmoid air
cells. No fluid levels are present otherwise.

The globes and orbits are within normal limits.
IMPRESSION: 1. Status post left frontal and parietal burr holes for evacuation
left extra-axial hemorrhage.
2. Left-sided pneumocephalus as expected
3. Marked decrease in midline shift.
4. Stable size mixed density right extra-axial hemorrhage, measuring
up to 8 mm imaging.

## 2021-07-13 IMAGING — CT CT HEAD W/O CM
3 of 4 series · 14 of 47 positions shown, 16 images · non-contrast
Comparison: Head CT 07/31/2019 and earlier

CLINICAL DATA: Subdural hemorrhage (subdural hematoma)

EXAM:
CT HEAD WITHOUT CONTRAST
TECHNIQUE: Contiguous axial images were obtained from the base of the skull
through the vertex without intravenous contrast.

[Series 2: head 5.00 hr40 s3 axial ibhc · axial · 0.42mm/px · z∈[-596,-451]mm · 8 of 35 slices shown, 10 images]
[im 3/35  brain]
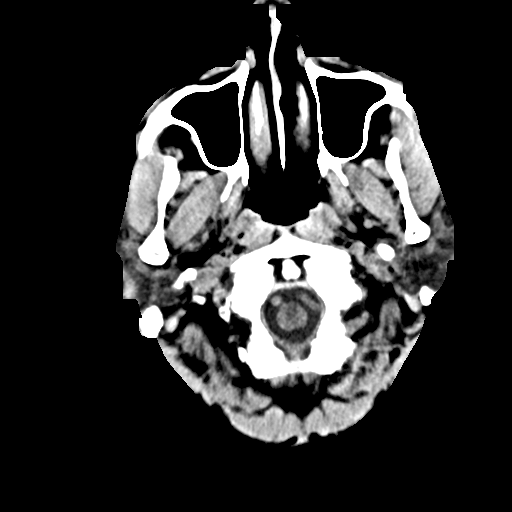
[im 3/35  bone]
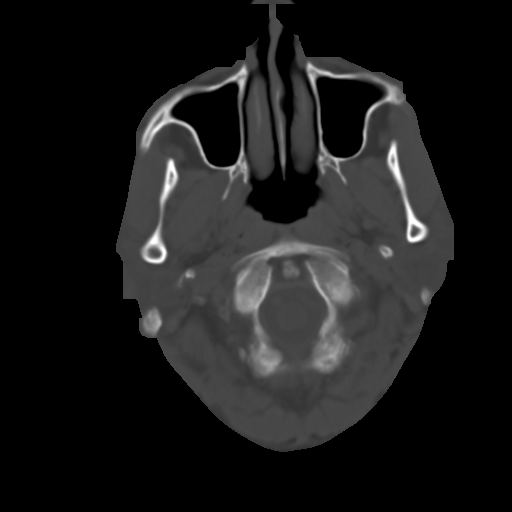
[im 8/35  brain]
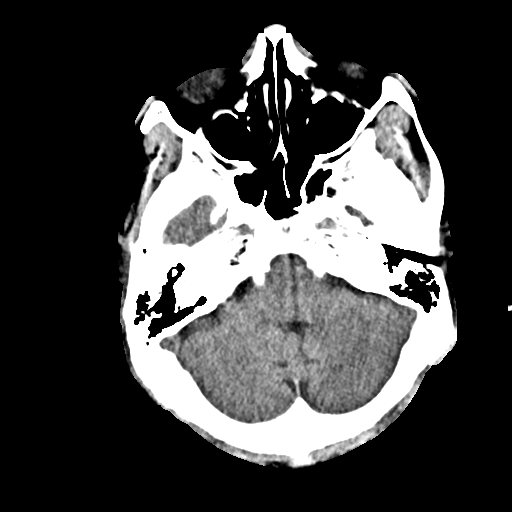
[im 13/35  brain]
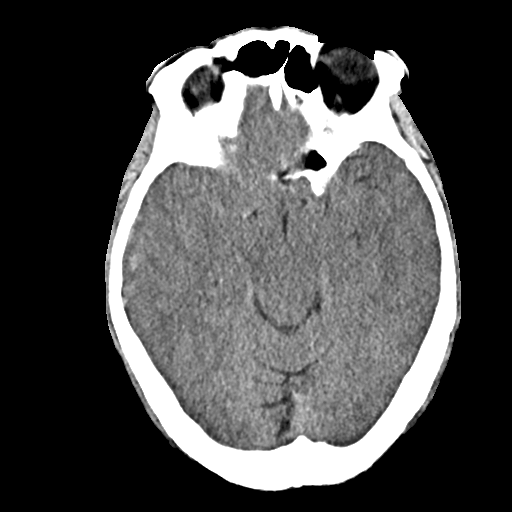
[im 15/35  brain]
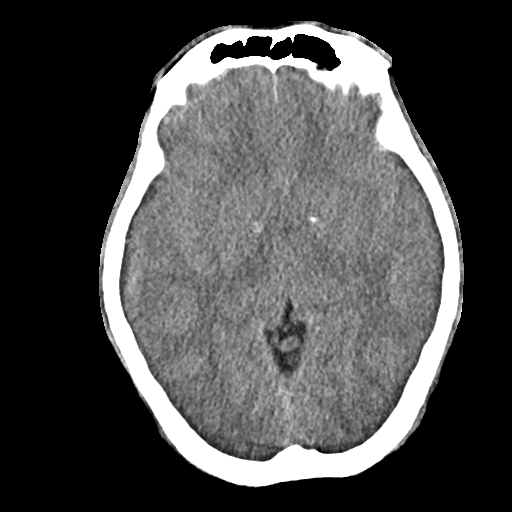
[im 20/35  brain]
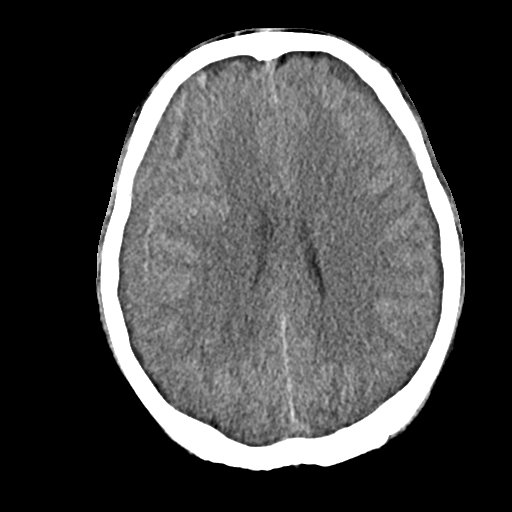
[im 20/35  bone]
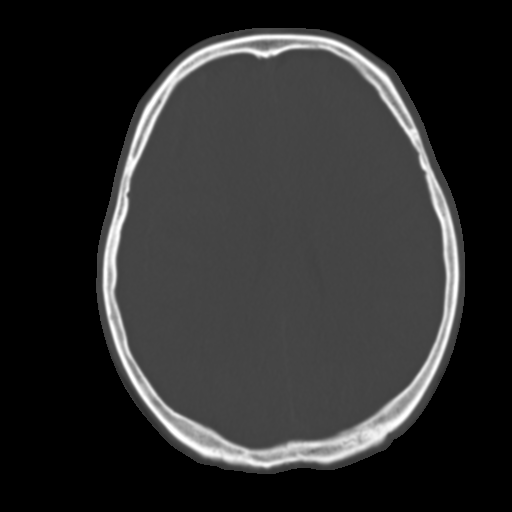
[im 22/35  brain]
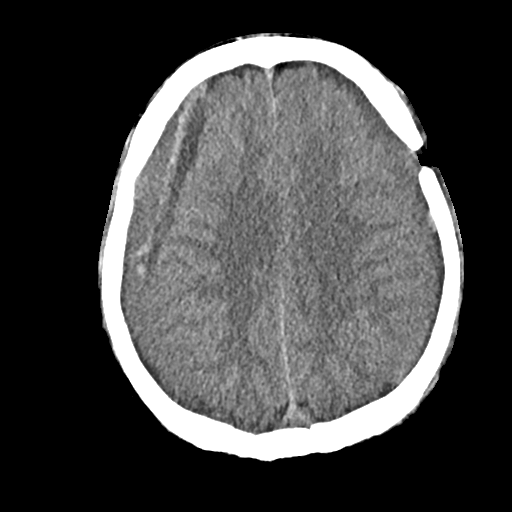
[im 27/35  brain]
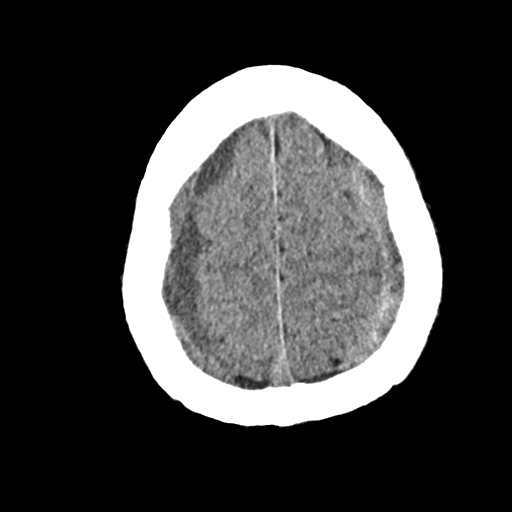
[im 32/35  brain]
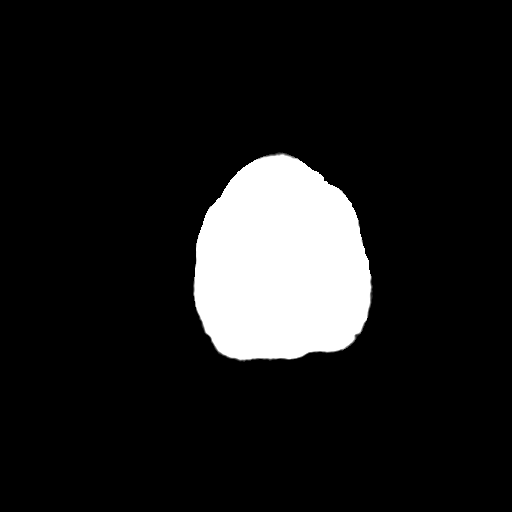

[Series 4: head 3.00 hr40 s3 sag · sagittal · 0.33mm/px · 3 of 60 slices shown]
[im 20/60  brain]
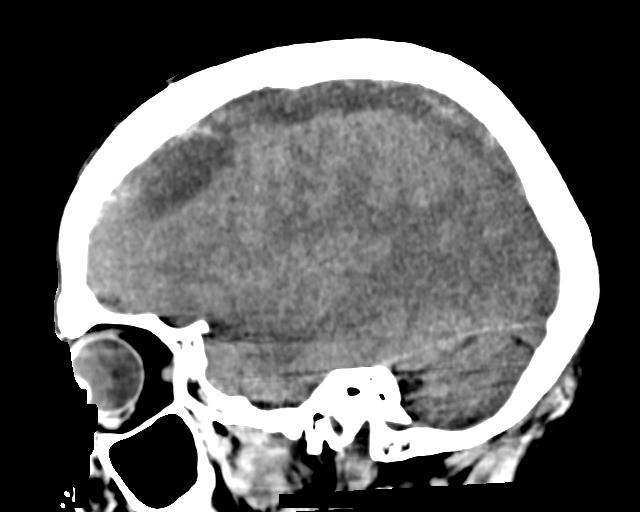
[im 30/60  brain]
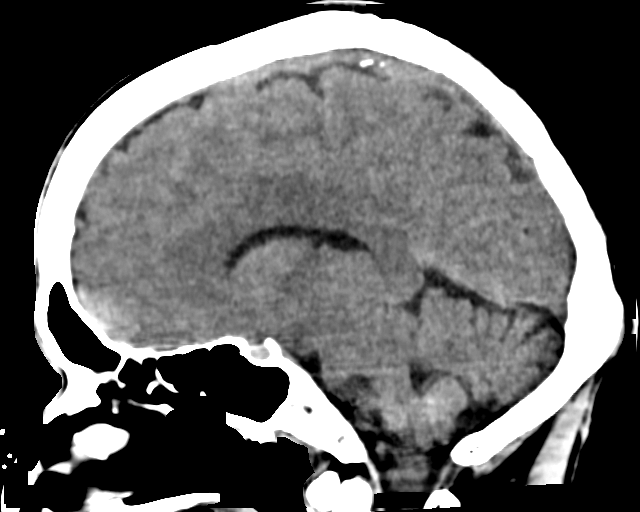
[im 40/60  brain]
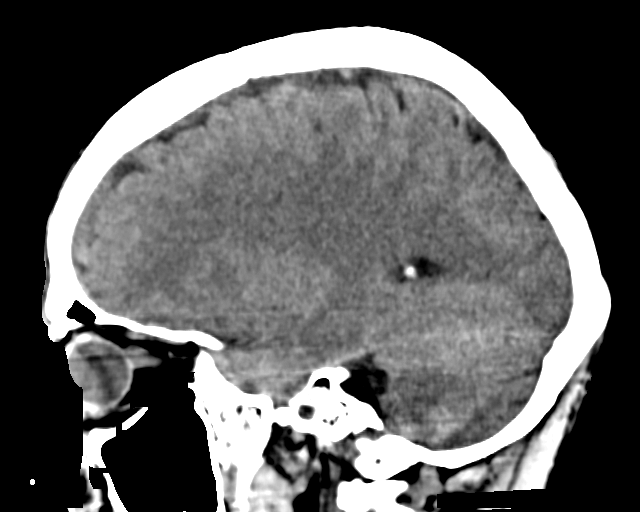

[Series 6: head 3.00 hr40 s3 cor · coronal · 0.33mm/px · 3 of 71 slices shown]
[im 24/71  brain]
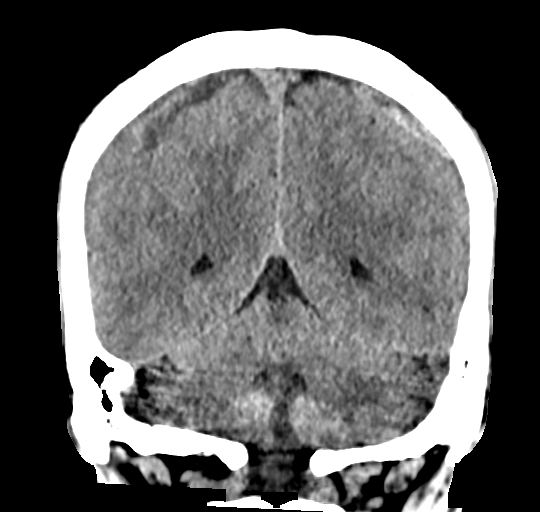
[im 32/71  brain]
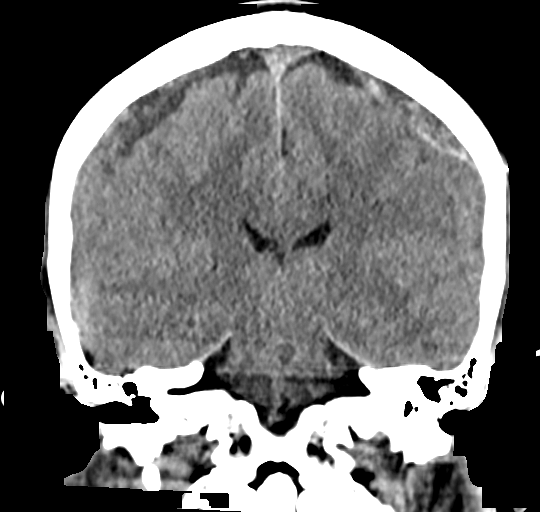
[im 39/71  brain]
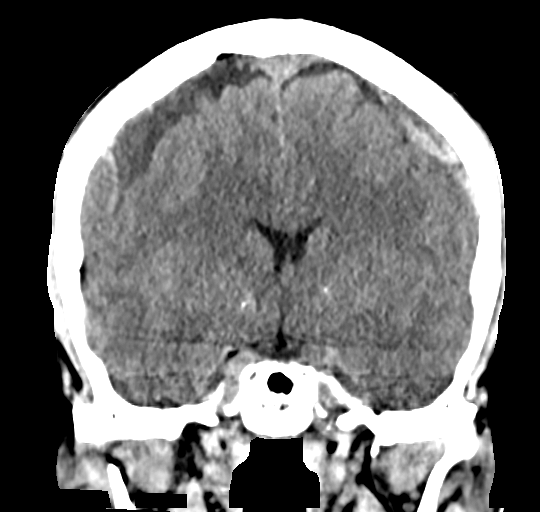

[14 of 47 positions shown; findings below may reference images not displayed]

FINDINGS: Brain: Interval removal of a previously demonstrated left subdural
drain. Previously demonstrated left-sided pneumocephalus is no
longer present. A left subdural collection overlying the left
frontoparietal convexity has slightly increased in thickness since
prior head CT 07/31/2019, now measuring 10 mm in greatest thickness
(previously 7 mm). The left subdural collection now contains regions
of intermediate and high density, consistent interval subdural
hemorrhage. Mild mass effect upon the underlying left frontoparietal
lobes.

A mixed density right subdural hematoma has also slightly increased
in size, now measuring up to 18 mm in thickness overlying the right
frontal lobe (previously 15 mm, remeasured on prior). There are
persistent regions of intermediate and high density within this
collection. Given the timeline since prior examination, this may
reflect interval right subdural hemorrhage. Slightly increased mass
effect upon the right cerebral hemisphere, particularly on the right
frontal lobe. There is now partial effacement of the right lateral
ventricle and 3 mm leftward midline shift.

No evidence of intraparenchymal hemorrhage. No demarcated cortical
infarct.

Vascular: No hyperdense vessel.

Skull: No calvarial fracture. Redemonstrated are 2 left-sided burr
holes.

Sinuses/Orbits: Imaged globes and orbits demonstrate no acute
abnormality. No significant paranasal sinus disease or mastoid
effusion.

The impression below will be called to the ordering clinician or
representative by the Radiologist Assistant, and communication
documented in the PACS or zVision Dashboard.
IMPRESSION: Slight interval increase in size of a left subdural collection now
measuring up to 10 mm in greatest thickness. New from prior exam,
there are regions of intermediate and high density within this
collection consistent with interval left subdural hemorrhage.

Slight interval increase in size of a right subdural hematoma now
measuring up to 18 mm in thickness. Persistent regions of
intermediate and high density within this hematoma. Given timeline
since prior exam, this may reflect interval right subdural
hemorrhage.

Slightly increased mass effect upon the underlying right cerebral
hemisphere. Partial effacement of the right lateral ventricle with 3
mm leftward midline shift.
